# Patient Record
Sex: Female | Born: 1960 | Race: Black or African American | Hispanic: No | Marital: Married | Smoking: Never smoker
Health system: Southern US, Community
[De-identification: ages and names within clinical notes are randomized; demographics above are authoritative.]

## PROBLEM LIST (undated history)

## (undated) DIAGNOSIS — R739 Hyperglycemia, unspecified: Secondary | ICD-10-CM

## (undated) DIAGNOSIS — A6920 Lyme disease, unspecified: Secondary | ICD-10-CM

## (undated) DIAGNOSIS — M549 Dorsalgia, unspecified: Secondary | ICD-10-CM

## (undated) HISTORY — DX: Hyperglycemia, unspecified: R73.9

## (undated) HISTORY — PX: BACK SURGERY: SHX140

---

## 1999-10-04 ENCOUNTER — Encounter: Payer: Self-pay | Admitting: Family Medicine

## 1999-10-04 ENCOUNTER — Encounter: Admission: RE | Admit: 1999-10-04 | Discharge: 1999-10-04 | Payer: Self-pay | Admitting: Family Medicine

## 2002-04-22 ENCOUNTER — Encounter: Admission: RE | Admit: 2002-04-22 | Discharge: 2002-04-22 | Payer: Self-pay | Admitting: Family Medicine

## 2002-04-22 ENCOUNTER — Encounter: Payer: Self-pay | Admitting: Family Medicine

## 2002-12-16 ENCOUNTER — Other Ambulatory Visit: Admission: RE | Admit: 2002-12-16 | Discharge: 2002-12-16 | Payer: Self-pay | Admitting: Obstetrics and Gynecology

## 2004-10-03 ENCOUNTER — Encounter: Admission: RE | Admit: 2004-10-03 | Discharge: 2004-10-03 | Payer: Self-pay | Admitting: Family Medicine

## 2005-05-31 ENCOUNTER — Emergency Department (HOSPITAL_COMMUNITY): Admission: EM | Admit: 2005-05-31 | Discharge: 2005-06-01 | Payer: Self-pay | Admitting: Emergency Medicine

## 2005-08-09 ENCOUNTER — Emergency Department (HOSPITAL_COMMUNITY): Admission: EM | Admit: 2005-08-09 | Discharge: 2005-08-09 | Payer: Self-pay | Admitting: Emergency Medicine

## 2005-08-31 ENCOUNTER — Encounter: Admission: RE | Admit: 2005-08-31 | Discharge: 2005-08-31 | Payer: Self-pay | Admitting: Family Medicine

## 2006-09-30 IMAGING — CR DG ANKLE COMPLETE 3+V*L*
3 series · 3 of 3 positions shown · non-contrast
Comparison: None.

CLINICAL DATA: Status-post twisting injury.
 LEFT ANKLE, 3-VIEWS:

[view not recorded (1 of 3)]
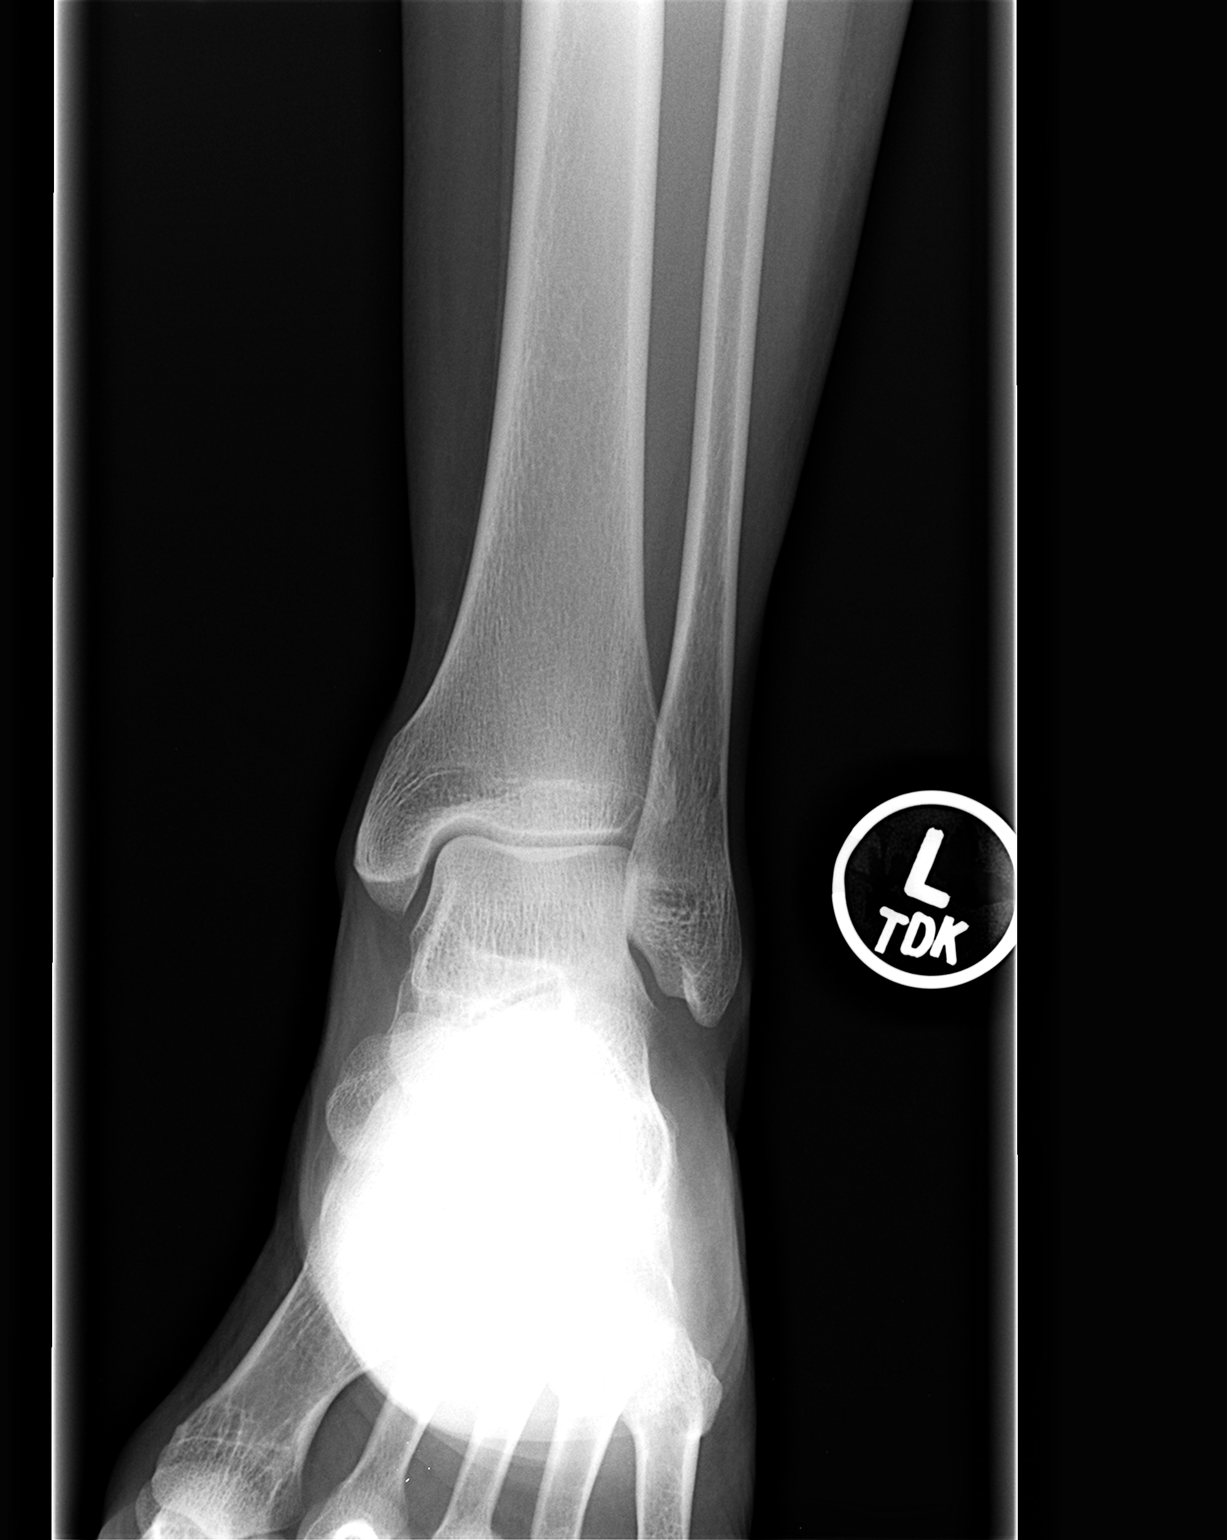

[view not recorded (2 of 3)]
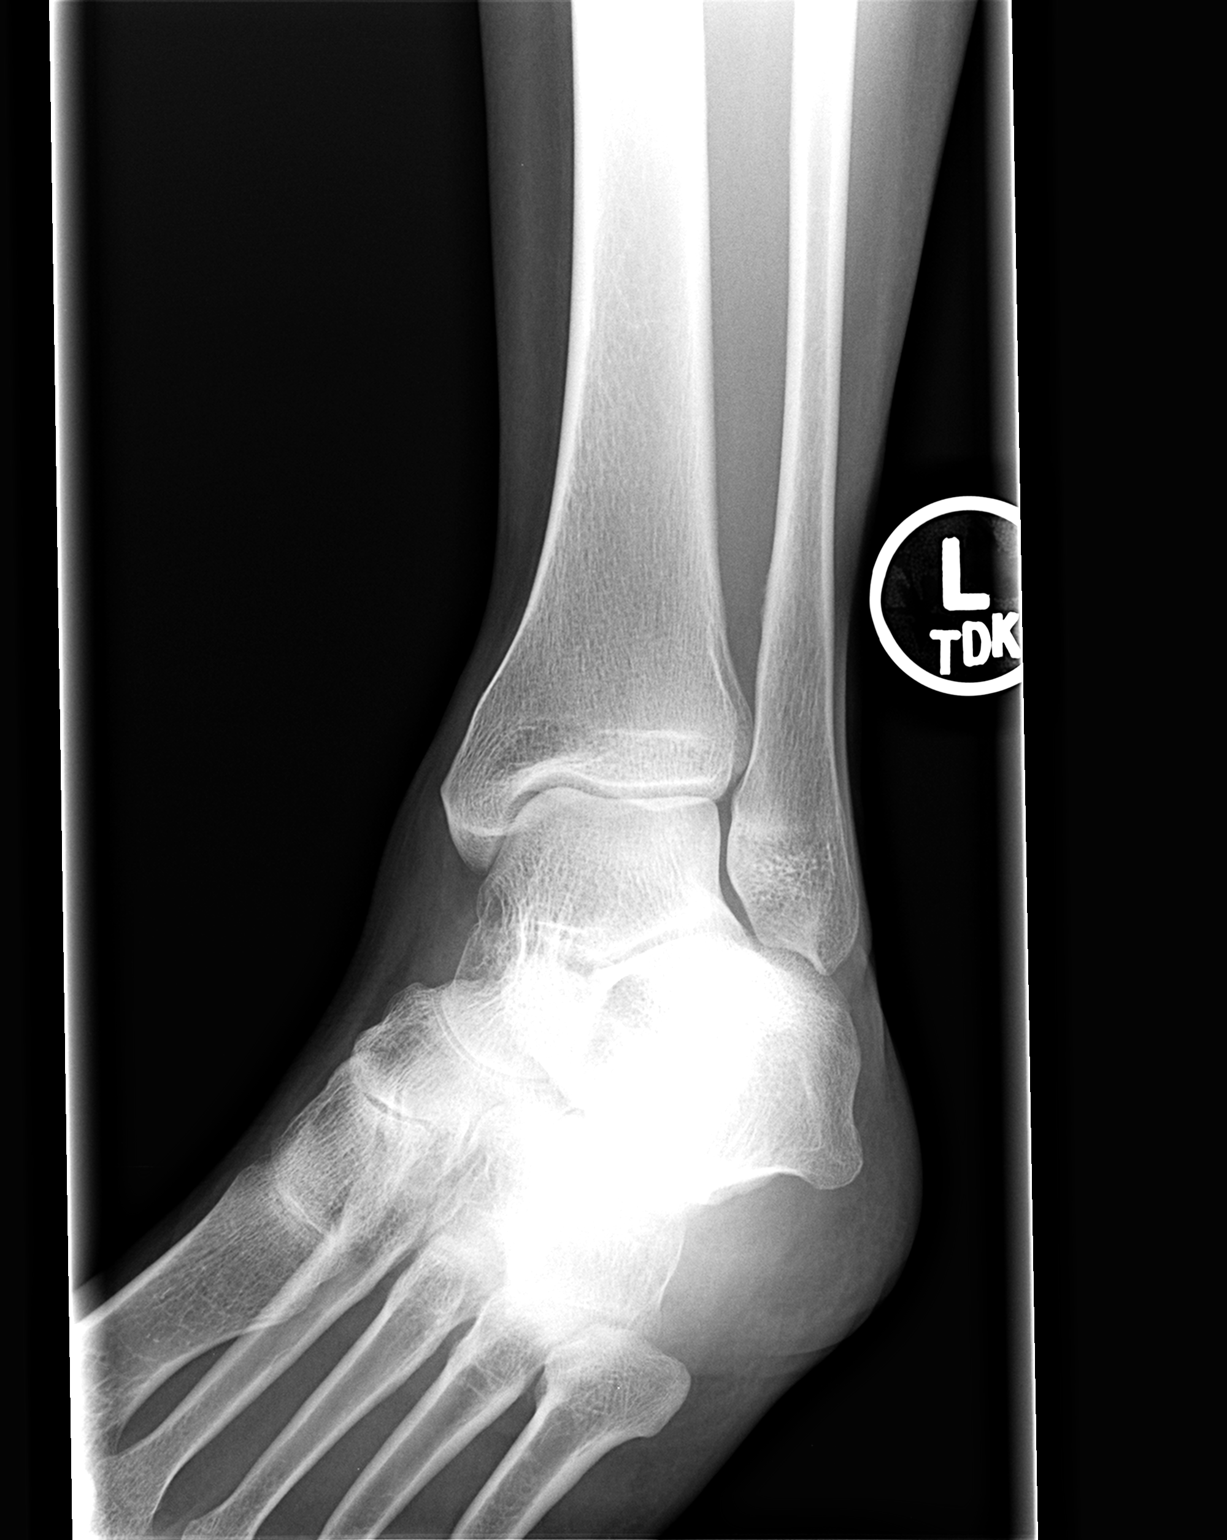

[view not recorded (3 of 3)]
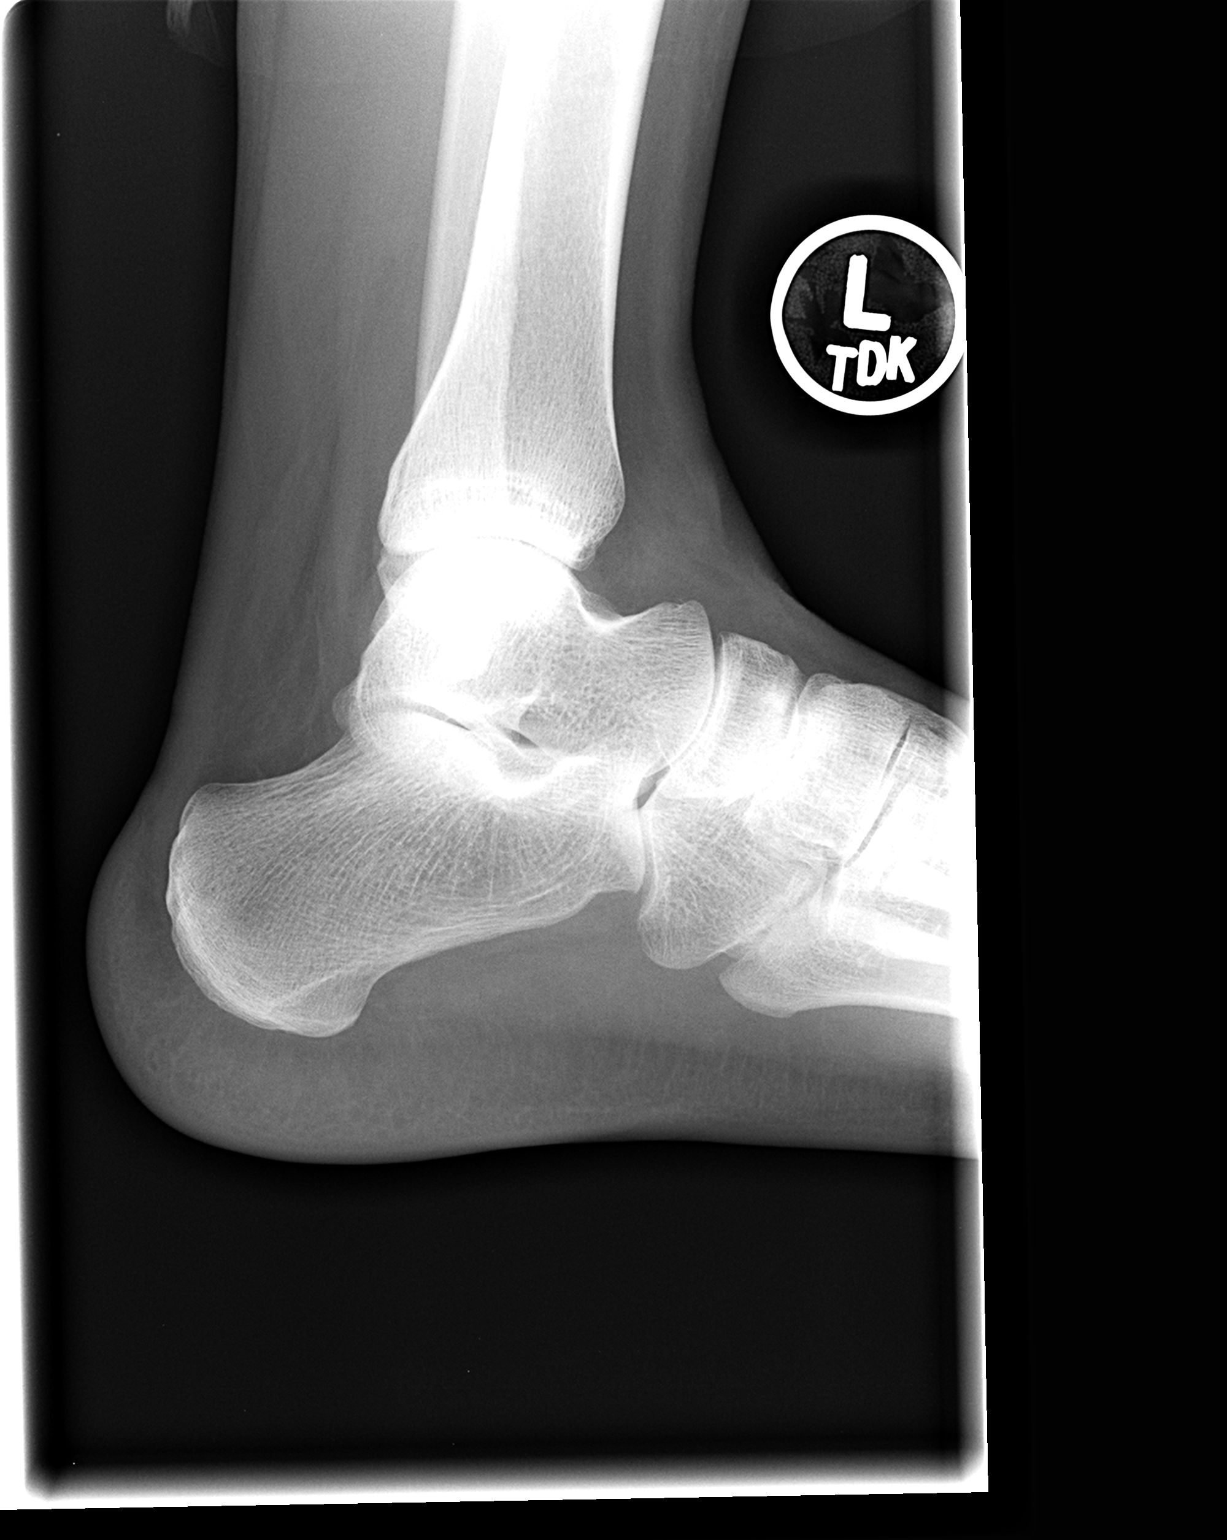

[3 of 3 positions shown; findings below may reference images not displayed]

FINDINGS: No acute radiographic abnormalities are noted.  Specifically, I see no fractures or dislocations.
IMPRESSION: No acute findings.

## 2008-02-09 ENCOUNTER — Encounter: Admission: RE | Admit: 2008-02-09 | Discharge: 2008-02-09 | Payer: Self-pay | Admitting: Family Medicine

## 2014-04-06 ENCOUNTER — Ambulatory Visit (INDEPENDENT_AMBULATORY_CARE_PROVIDER_SITE_OTHER): Payer: Self-pay | Admitting: Physical Therapy

## 2014-04-06 DIAGNOSIS — M2550 Pain in unspecified joint: Secondary | ICD-10-CM

## 2014-04-06 DIAGNOSIS — M542 Cervicalgia: Secondary | ICD-10-CM

## 2014-04-06 DIAGNOSIS — M6281 Muscle weakness (generalized): Secondary | ICD-10-CM

## 2014-04-08 ENCOUNTER — Encounter: Payer: Self-pay | Admitting: Physical Therapy

## 2014-04-13 ENCOUNTER — Encounter (INDEPENDENT_AMBULATORY_CARE_PROVIDER_SITE_OTHER): Payer: Self-pay | Admitting: Physical Therapy

## 2014-04-13 DIAGNOSIS — M2550 Pain in unspecified joint: Secondary | ICD-10-CM

## 2014-04-13 DIAGNOSIS — M6281 Muscle weakness (generalized): Secondary | ICD-10-CM

## 2014-04-13 DIAGNOSIS — M542 Cervicalgia: Secondary | ICD-10-CM

## 2014-04-15 ENCOUNTER — Encounter: Payer: Self-pay | Admitting: Physical Therapy

## 2014-04-20 ENCOUNTER — Encounter (INDEPENDENT_AMBULATORY_CARE_PROVIDER_SITE_OTHER): Payer: Self-pay | Admitting: Physical Therapy

## 2014-04-20 DIAGNOSIS — M542 Cervicalgia: Secondary | ICD-10-CM

## 2014-04-20 DIAGNOSIS — M6281 Muscle weakness (generalized): Secondary | ICD-10-CM

## 2014-04-20 DIAGNOSIS — M2550 Pain in unspecified joint: Secondary | ICD-10-CM

## 2014-04-28 ENCOUNTER — Encounter (INDEPENDENT_AMBULATORY_CARE_PROVIDER_SITE_OTHER): Payer: Self-pay | Admitting: Physical Therapy

## 2014-04-28 DIAGNOSIS — M2550 Pain in unspecified joint: Secondary | ICD-10-CM

## 2014-04-28 DIAGNOSIS — M542 Cervicalgia: Secondary | ICD-10-CM

## 2014-04-28 DIAGNOSIS — M6281 Muscle weakness (generalized): Secondary | ICD-10-CM

## 2014-05-13 ENCOUNTER — Encounter: Payer: Self-pay | Admitting: Physical Therapy

## 2014-05-26 ENCOUNTER — Encounter (INDEPENDENT_AMBULATORY_CARE_PROVIDER_SITE_OTHER): Payer: Self-pay | Admitting: Physical Therapy

## 2014-05-26 DIAGNOSIS — M542 Cervicalgia: Secondary | ICD-10-CM

## 2014-05-26 DIAGNOSIS — M2550 Pain in unspecified joint: Secondary | ICD-10-CM

## 2014-05-26 DIAGNOSIS — M6281 Muscle weakness (generalized): Secondary | ICD-10-CM

## 2015-11-19 ENCOUNTER — Encounter: Payer: Self-pay | Admitting: Emergency Medicine

## 2015-11-19 ENCOUNTER — Emergency Department
Admission: EM | Admit: 2015-11-19 | Discharge: 2015-11-20 | Disposition: A | Discharge status: Home / Self Care | Payer: BLUE CROSS/BLUE SHIELD | Attending: Emergency Medicine | Admitting: Emergency Medicine

## 2015-11-19 DIAGNOSIS — M545 Low back pain: Secondary | ICD-10-CM | POA: Diagnosis present

## 2015-11-19 DIAGNOSIS — N12 Tubulo-interstitial nephritis, not specified as acute or chronic: Secondary | ICD-10-CM | POA: Diagnosis not present

## 2015-11-19 HISTORY — DX: Dorsalgia, unspecified: M54.9

## 2015-11-19 LAB — CBC
HEMATOCRIT: 40.3 % (ref 35.0–47.0)
Hemoglobin: 13.6 g/dL (ref 12.0–16.0)
MCH: 30.2 pg (ref 26.0–34.0)
MCHC: 33.7 g/dL (ref 32.0–36.0)
MCV: 89.8 fL (ref 80.0–100.0)
Platelets: 154 10*3/uL (ref 150–440)
RBC: 4.49 MIL/uL (ref 3.80–5.20)
RDW: 13.2 % (ref 11.5–14.5)
WBC: 9.1 10*3/uL (ref 3.6–11.0)

## 2015-11-19 LAB — URINALYSIS COMPLETE WITH MICROSCOPIC (ARMC ONLY)
BACTERIA UA: NONE SEEN
Bilirubin Urine: NEGATIVE
Glucose, UA: NEGATIVE mg/dL
NITRITE: NEGATIVE
PROTEIN: NEGATIVE mg/dL
Specific Gravity, Urine: 1.017 (ref 1.005–1.030)
pH: 6 (ref 5.0–8.0)

## 2015-11-19 LAB — COMPREHENSIVE METABOLIC PANEL
ALT: 46 U/L (ref 14–54)
AST: 44 U/L — ABNORMAL HIGH (ref 15–41)
Albumin: 4.7 g/dL (ref 3.5–5.0)
Alkaline Phosphatase: 89 U/L (ref 38–126)
Anion gap: 11 (ref 5–15)
BUN: 18 mg/dL (ref 6–20)
CO2: 21 mmol/L — ABNORMAL LOW (ref 22–32)
Calcium: 9.6 mg/dL (ref 8.9–10.3)
Chloride: 111 mmol/L (ref 101–111)
Creatinine, Ser: 1.08 mg/dL — ABNORMAL HIGH (ref 0.44–1.00)
GFR calc Af Amer: >60 mL/min (ref >60)
GFR calc non Af Amer: 57 mL/min — ABNORMAL LOW (ref >60)
Glucose, Bld: 99 mg/dL (ref 65–99)
Potassium: 3.5 mmol/L (ref 3.5–5.1)
Sodium: 143 mmol/L (ref 135–145)
Total Bilirubin: 0.5 mg/dL (ref 0.3–1.2)
Total Protein: 8.1 g/dL (ref 6.5–8.1)

## 2015-11-19 LAB — LIPASE, BLOOD: Lipase: 17 U/L (ref 11–51)

## 2015-11-19 MED ORDER — PENTAFLUOROPROP-TETRAFLUOROETH EX AERO
INHALATION_SPRAY | CUTANEOUS | Status: AC
  Filled 2015-11-19: qty 30

## 2015-11-19 MED ORDER — ONDANSETRON HCL 4 MG/2ML IJ SOLN
4.0000 mg | Freq: Once | INTRAMUSCULAR | Status: AC
  Administered 2015-11-19: 4 mg via INTRAVENOUS
  Filled 2015-11-19: qty 2

## 2015-11-19 MED ORDER — ONDANSETRON 4 MG PO TBDP
4.0000 mg | ORAL_TABLET | Freq: Once | ORAL | Status: AC | PRN
Start: 2015-11-19 — End: 2015-11-19
  Administered 2015-11-19: 4 mg via ORAL
  Filled 2015-11-19: qty 1

## 2015-11-19 MED ORDER — OXYCODONE-ACETAMINOPHEN 5-325 MG PO TABS
1.0000 | ORAL_TABLET | Freq: Once | ORAL | Status: AC
  Administered 2015-11-19: 1 via ORAL
  Filled 2015-11-19: qty 1

## 2015-11-19 MED ORDER — CEPHALEXIN 500 MG PO CAPS: 500.0000 mg | ORAL_CAPSULE | Freq: Two times a day (BID) | ORAL | Status: DC

## 2015-11-19 MED ORDER — SODIUM CHLORIDE 0.9 % IV BOLUS (SEPSIS)
1000.0000 mL | Freq: Once | INTRAVENOUS | Status: AC
  Administered 2015-11-19: 1000 mL via INTRAVENOUS

## 2015-11-19 MED ORDER — DEXTROSE 5 % IV SOLN
1.0000 g | Freq: Once | INTRAVENOUS | Status: AC
  Administered 2015-11-19: 1 g via INTRAVENOUS
  Filled 2015-11-19: qty 10

## 2015-11-19 MED ORDER — MORPHINE SULFATE (PF) 4 MG/ML IV SOLN
4.0000 mg | Freq: Once | INTRAVENOUS | Status: AC
  Administered 2015-11-19: 4 mg via INTRAVENOUS
  Filled 2015-11-19: qty 1

## 2015-11-19 MED ORDER — OXYCODONE-ACETAMINOPHEN 5-325 MG PO TABS: 1.0000 | ORAL_TABLET | Freq: Four times a day (QID) | ORAL | Status: DC | PRN

## 2015-11-19 NOTE — Discharge Instructions (Signed)
You were prescribed a medication that is potentially sedating. Do not drink alcohol, drive or participate in any other potentially dangerous activities while taking this medication as it may make you sleepy. Do not take this medication with any other sedating medications, either prescription or over-the-counter. If you were prescribed Percocet or Vicodin, do not take these with acetaminophen (Tylenol) as it is already contained within these medications. °  °Opioid pain medications (or "narcotics") can be habit forming.  Use it as little as possible to achieve adequate pain control.  Do not use or use it with extreme caution if you have a history of opiate abuse or dependence.  If you are on a pain contract with your primary care doctor or a pain specialist, be sure to let them know you were prescribed this medication today from the  [facility].  This medication is intended for your use only - do not give any to anyone else and keep it in a secure place where nobody else, especially children and pets, have access to it.  It will also cause or worsen constipation, so you may want to consider taking an over-the-counter stool softener while you are taking this medication. ° °Pyelonephritis, Adult °Pyelonephritis is a kidney infection. The kidneys are the organs that filter a person's blood and move waste out of the bloodstream and into the urine. Urine passes from the kidneys, through the ureters, and into the bladder. There are two main types of pyelonephritis: °· Infections that come on quickly without any warning (acute pyelonephritis). °· Infections that last for a long period of time (chronic pyelonephritis). °In most cases, the infection clears up with treatment and does not cause further problems. More severe infections or chronic infections can sometimes spread to the bloodstream or lead to other problems with the kidneys. °CAUSES °This condition is usually caused by: °· Bacteria  traveling from the bladder to the kidney through infected urine. The urine in the bladder can become infected with bacteria from: °¨ Bladder infection (cystitis). °¨ Inflammation of the prostate gland (prostatitis). °¨ Sexual intercourse, in females. °· Bacteria traveling from the bloodstream to the kidney. °RISK FACTORS °This condition is more likely to develop in: °· Pregnant women. °· Older people. °· People who have diabetes. °· People who have kidney stones or bladder stones. °· People who have other abnormalities of the kidney or ureter. °· People who have a catheter placed in the bladder. °· People who have cancer. °· People who are sexually active. °· Women who use spermicides. °· People who have had a prior urinary tract infection. °SYMPTOMS °Symptoms of this condition include: °· Frequent urination. °· Strong or persistent urge to urinate. °· Burning or stinging when urinating. °· Abdominal pain. °· Back pain. °· Pain in the side or flank area. °· Fever. °· Chills. °· Blood in the urine, or dark urine. °· Nausea. °· Vomiting. °DIAGNOSIS °This condition may be diagnosed based on: °· Medical history and physical exam. °· Urine tests. °· Blood tests. °You may also have imaging tests of the kidneys, such as an ultrasound or CT scan. °TREATMENT °Treatment for this condition may depend on the severity of the infection. °· If the infection is mild and is found early, you may be treated with antibiotic medicines taken by mouth. You will need to drink fluids to remain hydrated. °· If the infection is more severe, you may need to stay in the hospital and receive antibiotics given directly into a vein through an   IV tube. You may also need to receive fluids through an IV tube if you are not able to remain hydrated. After your hospital stay, you may need to take oral antibiotics for a period of time. °Other treatments may be required, depending on the cause of the infection. °HOME CARE INSTRUCTIONS °Medicines °· Take  over-the-counter and prescription medicines only as told by your health care provider. °· If you were prescribed an antibiotic medicine, take it as told by your health care provider. Do not stop taking the antibiotic even if you start to feel better. °General Instructions °· Drink enough fluid to keep your urine clear or pale yellow. °· Avoid caffeine, tea, and carbonated beverages. They tend to irritate the bladder. °· Urinate often. Avoid holding in urine for long periods of time. °· Urinate before and after sex. °· After a bowel movement, women should cleanse from front to back. Use each tissue only once. °· Keep all follow-up visits as told by your health care provider. This is important. °SEEK MEDICAL CARE IF: °· Your symptoms do not get better after 2 days of treatment. °· Your symptoms get worse. °· You have a fever. °SEEK IMMEDIATE MEDICAL CARE IF: °· You are unable to take your antibiotics or fluids. °· You have shaking chills. °· You vomit. °· You have severe flank or back pain. °· You have extreme weakness or fainting. °  °This information is not intended to replace advice given to you by your health care provider. Make sure you discuss any questions you have with your health care provider. °  °Document Released: 09/02/2005 Document Revised: 05/24/2015 Document Reviewed: 12/26/2014 °Elsevier Interactive Patient Education ©2016 [employer] ° °

## 2015-11-19 NOTE — ED Provider Notes (Signed)
[facility] Emergency Department Provider Note  ____________________________________________  Time seen: 10:40 PM  I have reviewed the triage vital signs and the nursing notes.   HISTORY  Chief Complaint Back Pain and Abdominal Pain    HPI Paula Ramirez is a 55 y.o. female who complains of left lower back pain for the past few hours. Gradual onset, constant since then. Gradually worsening. She's also had dysuria and urgency today. Denies fever chills, positive nausea, vomited once today. Nausea is improved at present time. No diarrhea or constipation. Normal bowel movements including one today  No chest pain or shortness of breath...     Past Medical History  Diagnosis Date  . Back pain      There are no active problems to display for this patient.    Past Surgical History  Procedure Laterality Date  . Back surgery       Current Outpatient Rx  Name  Route  Sig  Dispense  Refill  . cephALEXin (KEFLEX) 500 MG capsule   Oral   Take 1 capsule (500 mg total) by mouth 2 (two) times daily.   14 capsule   0   . oxyCODONE-acetaminophen (ROXICET) 5-325 MG tablet   Oral   Take 1 tablet by mouth every 6 (six) hours as needed for severe pain.   12 tablet   0      Allergies Celebrex   History reviewed. No pertinent family history.  Social History Social History  Substance Use Topics  . Smoking status: Never Smoker   . Smokeless tobacco: None  . Alcohol Use: No    Review of Systems  Constitutional:   No fever or chills. No weight changes Eyes:   No blurry vision or double vision.  ENT:   No sore throat.  Cardiovascular:   No chest pain. Respiratory:   No dyspnea or cough. Gastrointestinal:   Negative for abdominal pain, no constipation or diarrhea. Vomited times one.  No BRBPR or melena. Genitourinary:   Positive dysuria and urgency. No hematuria. Musculoskeletal:   Positive left flank pain today. Chronic right  sciatica. Skin:   Negative for rash. Neurological:   Negative for headaches, focal weakness or numbness. Psychiatric:  No anxiety or depression.   Endocrine:  No changes in energy or sleep difficulty.  10-point ROS otherwise negative.  ____________________________________________   PHYSICAL EXAM:  VITAL SIGNS: ED Triage Vitals  Enc Vitals Group     BP 11/19/15 1909 149/84 mmHg     Pulse Rate 11/19/15 1909 79     Resp 11/19/15 1909 18     Temp 11/19/15 1909 98.4 F (36.9 C)     Temp Source 11/19/15 1909 Oral     SpO2 11/19/15 1909 100 %     Weight 11/19/15 1909 140 lb (63.504 kg)     Height 11/19/15 1909 5' 3.5" (1.613 m)     Head Cir --      Peak Flow --      Pain Score 11/19/15 1910 10     Pain Loc --      Pain Edu? --      Excl. in GC? --     Vital signs reviewed, nursing assessments reviewed.   Constitutional:   Alert and oriented. Well appearing and in no distress. Eyes:   No scleral icterus. No conjunctival pallor. PERRL. EOMI ENT   Head:   Normocephalic and atraumatic.   Nose:   No congestion/rhinnorhea. No septal hematoma   Mouth/Throat:  MMM, no pharyngeal erythema. No peritonsillar mass.    Neck:   No stridor. No SubQ emphysema. No meningismus. Hematological/Lymphatic/Immunilogical:   No cervical lymphadenopathy. Cardiovascular:   RRR. Symmetric bilateral radial and DP pulses.  No murmurs.  Respiratory:   Normal respiratory effort without tachypnea nor retractions. Breath sounds are clear and equal bilaterally. No wheezes/rales/rhonchi. Gastrointestinal:   Soft with mild mid left-sided tenderness. Non distended. There is left-sided CVA tenderness.  No rebound, rigidity, or guarding. Genitourinary:   deferred Musculoskeletal:   Nontender with normal range of motion in all extremities. No joint effusions.  No lower extremity tenderness.  No edema. Neurologic:   Normal speech and language.  CN 2-10 normal. Motor grossly intact. No gross focal  neurologic deficits are appreciated.  Skin:    Skin is warm, dry and intact. No rash noted.  No petechiae, purpura, or bullae. Psychiatric:   Mood and affect are normal. ____________________________________________    LABS (pertinent positives/negatives) (all labs ordered are listed, but only abnormal results are displayed) Labs Reviewed  COMPREHENSIVE METABOLIC PANEL - Abnormal; Notable for the following:    CO2 21 (*)    Creatinine, Ser 1.08 (*)    AST 44 (*)    GFR calc non Af Amer 57 (*)    All other components within normal limits  URINALYSIS COMPLETEWITH MICROSCOPIC ([facility] ONLY) - Abnormal; Notable for the following:    Color, Urine YELLOW (*)    APPearance HAZY (*)    Ketones, ur TRACE (*)    Hgb urine dipstick 1+ (*)    Leukocytes, UA 3+ (*)    Squamous Epithelial / LPF 0-5 (*)    All other components within normal limits  URINE CULTURE  LIPASE, BLOOD  CBC   ____________________________________________   EKG    ____________________________________________    RADIOLOGY    ____________________________________________   PROCEDURES   ____________________________________________   INITIAL IMPRESSION / ASSESSMENT AND PLAN / ED COURSE  Pertinent labs & imaging results that were available during my care of the patient were reviewed by me and considered in my medical decision making (see chart for details).  Patient presents with symptoms of urinary tract infection, exam and urinalysis consistent with UTI and pyelonephritis. Vital signs are unremarkable she is well-appearing no acute distress. Kidney function is good. Relatively young and healthy. We'll give IV fluids and ceftriaxone and discharged on Keflex. Follow up with primary care. Urine culture sent.  No history of kidney stones, by current symptoms I have a low suspicion for an infected kidney stone   ____________________________________________   FINAL CLINICAL IMPRESSION(S) / ED  DIAGNOSES  Final diagnoses:  Pyelonephritis      Sharman CheekPhillip Paraskevi Funez, MD 11/19/15 2310

## 2015-11-19 NOTE — ED Notes (Signed)
Pt c/o left lower back pain x 20 minutes; stomach feels "achy"; vomited once; denies nausea at present; last voided about 25 minutes ago-denies dysuria; denies diarrhea; denies injury; history of lower right back pain; pain radiates down the outer and front left leg;

## 2015-11-21 LAB — URINE CULTURE

## 2016-03-26 ENCOUNTER — Ambulatory Visit: Payer: BLUE CROSS/BLUE SHIELD | Admitting: Dietician

## 2016-04-15 ENCOUNTER — Encounter: Payer: BLUE CROSS/BLUE SHIELD | Attending: Obstetrics and Gynecology | Admitting: Dietician

## 2016-04-15 ENCOUNTER — Encounter: Payer: Self-pay | Admitting: Dietician

## 2016-04-15 ENCOUNTER — Ambulatory Visit: Payer: BLUE CROSS/BLUE SHIELD | Admitting: Dietician

## 2016-04-15 DIAGNOSIS — R739 Hyperglycemia, unspecified: Secondary | ICD-10-CM | POA: Diagnosis present

## 2016-04-15 DIAGNOSIS — R7303 Prediabetes: Secondary | ICD-10-CM

## 2016-04-15 NOTE — Progress Notes (Signed)
  Medical Nutrition Therapy:  Appt start time: 1530 end time:  1630.   Assessment:  Primary concerns today: Patient is here today alone due to referral for hyperglycemia.  Her HgbA1C was 6.0% 10/17/15. Other Labs noted include:  Vitamin D 28.7 (1/31/170, cholesterol 234, triglycerides 114, HDL 66, LDL 145..  Today's weight 134 lbs which had decreased from 140 lbs in February. She has already made changes by reducing the carbohydrate amount in her beverages and decreasing the frequency of other sweets.  Patient lives with her husband.  Her husband does most of the shopping and cooking or they eat out.  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  She visits her mother on the weekend and takes care of her house and does her shopping.  Her father has dementia.     Preferred Learning Style:   No preference indicated   Learning Readiness:   Ready  Change in progress   MEDICATIONS: see list to include vitamin D, vitamin E and B-complex   DIETARY INTAKE:  Usual eating pattern includes 3 meals and 0 snacks per day. She states that she eats to live rather than living to eat.  24-hr recall:  B (9 AM): unsweetened hot tea and NABS Snk ( AM): none or fruit  L ( PM): Fast food:  Wendy's salad or Jr. Cheeseburger delux OR [employer] salad Snk ( PM): none D ( PM): spaghetti and garlic bread or pork chops, peas or corn Snk ( PM): none Beverages: unsweetened hot tea, water, rare regular soda OR regular lemonade  Usual physical activity: none  Estimated energy needs: 1600 calories 180 g carbohydrates 120 g protein 44 g fat  Progress Towards Goal(s):  In progress.   Nutritional Diagnosis:  NB-1.1 Food and nutrition-related knowledge deficit As related to balance of carbohydrate, protein, and fat.  As evidenced by diet hx and patient report.    Intervention:  Nutrition counseling/education related to prediabetes. Discussed Carb Counting by  food group as method of portion control, reading food labels, and benefits of increased activity. Also discussed basic physiology of Diabetes, target BG ranges pre and post meals, and A1c. Discussed importance of nutrient varied diet and the importance of exercise.  Begin some form of exercise.  Aim for 30 minutes most day. Continue to choose beverages without added sugar. Bake, broil, or grill rather than fry. Read the label for carbohydrate content and portion size.   Teaching Method Utilized:  Visual Auditory Hands on  Handouts given during visit include: Living Well with Diabetes Carb Counting and Food Label handouts Meal Plan Card  Breakfast ideas  Label reading  A1C sheet  Barriers to learning/adherence to lifestyle change: none  Demonstrated degree of understanding via:  Teach Back   Monitoring/Evaluation:  Dietary intake, exercise, label reading, and body weight prn.

## 2016-04-15 NOTE — Patient Instructions (Signed)
Begin some form of exercise.  Aim for 30 minutes most day. Continue to choose beverages without added sugar. Bake, broil, or grill rather than fry. Read the label for carbohydrate content and portion size.

## 2017-04-09 ENCOUNTER — Other Ambulatory Visit: Payer: Self-pay | Admitting: Surgery

## 2017-04-09 DIAGNOSIS — M7989 Other specified soft tissue disorders: Secondary | ICD-10-CM

## 2017-04-09 DIAGNOSIS — R2232 Localized swelling, mass and lump, left upper limb: Principal | ICD-10-CM

## 2017-04-21 ENCOUNTER — Ambulatory Visit
Admission: RE | Admit: 2017-04-21 | Discharge: 2017-04-21 | Disposition: A | Discharge status: Home / Self Care | Payer: BLUE CROSS/BLUE SHIELD | Source: Ambulatory Visit | Attending: Surgery | Admitting: Surgery

## 2017-04-21 DIAGNOSIS — R2232 Localized swelling, mass and lump, left upper limb: Principal | ICD-10-CM

## 2017-04-21 DIAGNOSIS — M7989 Other specified soft tissue disorders: Secondary | ICD-10-CM

## 2017-04-21 MED ORDER — GADOBENATE DIMEGLUMINE 529 MG/ML IV SOLN
13.0000 mL | Freq: Once | INTRAVENOUS | Status: AC | PRN
  Administered 2017-04-21: 13 mL via INTRAVENOUS

## 2017-10-14 DIAGNOSIS — M51369 Other intervertebral disc degeneration, lumbar region without mention of lumbar back pain or lower extremity pain: Secondary | ICD-10-CM | POA: Insufficient documentation

## 2018-08-14 ENCOUNTER — Encounter: Payer: Self-pay | Admitting: Emergency Medicine

## 2018-08-14 ENCOUNTER — Ambulatory Visit
Admission: EM | Admit: 2018-08-14 | Discharge: 2018-08-14 | Disposition: A | Discharge status: Home / Self Care | Payer: BLUE CROSS/BLUE SHIELD | Attending: Emergency Medicine | Admitting: Emergency Medicine

## 2018-08-14 ENCOUNTER — Other Ambulatory Visit: Payer: Self-pay

## 2018-08-14 DIAGNOSIS — L509 Urticaria, unspecified: Secondary | ICD-10-CM | POA: Diagnosis not present

## 2018-08-14 HISTORY — DX: Lyme disease, unspecified: A69.20

## 2018-08-14 MED ORDER — PREDNISONE 10 MG (21) PO TBPK: ORAL_TABLET | ORAL | 0 refills | Status: DC

## 2018-08-14 MED ORDER — HYDROXYZINE HCL 50 MG PO TABS: 50.0000 mg | ORAL_TABLET | Freq: Three times a day (TID) | ORAL | 0 refills | Status: DC | PRN

## 2018-08-14 NOTE — ED Provider Notes (Signed)
HPI  SUBJECTIVE:  Paula Ramirez is a 57 y.o. female who presents with red raised, migratory, itchy rash over her whole body for the past month.  She tried over-the-counter cortisone cream, eczema cream and Benadryl cream with very temporary improvement in her symptoms.  No aggravating factors.  She states that each lesion takes about 2 to 3 days to completely resolve.  No new lotions, soaps, detergents, she does not take any medications on a regular basis. She takes vitamins.  New foods, drinks.  No recent antibiotics.  No travel, hotel stays prior to rash starting.  No blood on the bed clothes in the morning, sensation of being bitten at night.  Her husband does not have a similar rash.  No lip or tongue swelling, wheezing, chest pain, shortness of breath, abdominal pain, diarrhea, syncope.  This rash is not related to exercise.  She has a past medical history of eczema, Lyme disease.  No history of allergies, anaphylaxis, asthma.  PMD: [place].   Past Medical History:  Diagnosis Date  . Back pain   . Hyperglycemia   . Lyme disease     Past Surgical History:  Procedure Laterality Date  . BACK SURGERY      Family History  Problem Relation Age of Onset  . Hypertension Mother   . Diabetes Mother     Social History   Tobacco Use  . Smoking status: Never Smoker  . Smokeless tobacco: Never Used  Substance Use Topics  . Alcohol use: No  . Drug use: Never    No current facility-administered medications for this encounter.   Current Outpatient Medications:  .  b complex vitamins tablet, Take 1 tablet by mouth daily., Disp: , Rfl:  .  Multiple Vitamin (MULTIVITAMIN) tablet, Take 1 tablet by mouth daily., Disp: , Rfl:  .  hydrOXYzine (ATARAX/VISTARIL) 50 MG tablet, Take 1 tablet (50 mg total) by mouth 3 (three) times daily as needed., Disp: 30 tablet, Rfl: 0 .  predniSONE (STERAPRED UNI-PAK 21 TAB) 10 MG (21) TBPK tablet, Dispense one 6 day pack. Take as directed with  food., Disp: 21 tablet, Rfl: 0  Allergies  Allergen Reactions  . Celebrex [Celecoxib] Itching     ROS  As noted in HPI.   Physical Exam  BP 127/83 (BP Location: Left Arm)   Pulse 69   Temp 97.6 F (36.4 C) (Oral)   Resp 16   Ht 5\' 3"  (1.6 m)   Wt 63.5 kg   SpO2 100%   BMI 24.80 kg/m   Constitutional: Well developed, well nourished, no acute distress Eyes:  EOMI, conjunctiva normal bilaterally HENT: Normocephalic, atraumatic,mucus membranes moist, no angioedema Respiratory: Normal inspiratory effort, lungs clear bilaterally, good air movement Cardiovascular: Normal rate GI: nondistended skin: Positive scattered blanchable nontender urticaria over arms, neck.               Musculoskeletal: no deformities Neurologic: Alert & oriented x 3, no focal neuro deficits Psychiatric: Speech and behavior appropriate   ED Course   Medications - No data to display  No orders of the defined types were placed in this encounter.   No results found for this or any previous visit (from the past 24 hour(s)). No results found.  ED Clinical Impression  Urticaria   ED Assessment/Plan  Patient with urticaria.  Unable to identify cause.  Sending home with 6-day prednisone taper, Zyrtec, Atarax. advised her to follow-up with an allergist, provided information for [facility] allergy and asthma.  Discussed MDM, treatment plan, and plan for follow-up with patient.  patient agrees with plan.   Meds ordered this encounter  Medications  . hydrOXYzine (ATARAX/VISTARIL) 50 MG tablet    Sig: Take 1 tablet (50 mg total) by mouth 3 (three) times daily as needed.    Dispense:  30 tablet    Refill:  0  . predniSONE (STERAPRED UNI-PAK 21 TAB) 10 MG (21) TBPK tablet    Sig: Dispense one 6 day pack. Take as directed with food.    Dispense:  21 tablet    Refill:  0    *This clinic note was created using Scientist, clinical (histocompatibility and immunogenetics)Dragon dictation software. Therefore, there may be occasional mistakes  despite careful proofreading.   ?   Domenick GongMortenson, Raye Slyter, MD 08/14/18 [id number]

## 2018-08-14 NOTE — ED Triage Notes (Signed)
Patient c/o itchy rash on her arms and neck that started a month ago.

## 2018-08-14 NOTE — Discharge Instructions (Addendum)
Start some Zyrtec in addition to the prednisone.  You may take Atarax if Zyrtec does not work.  Check your bed for bedbugs although I doubt that this is the cause.  [facility]

## 2018-08-21 IMAGING — MR MR SHOULDER*L* WO/W CM
10 series · 40 of 40 positions shown · IV contrast (13ml multihance)
Comparison: None.

CLINICAL DATA: Soft tissue mass of the shoulder x1 year, large and
tender.

EXAM:
MRI OF THE LEFT SHOULDER WITHOUT AND WITH CONTRAST
TECHNIQUE: Multiplanar, multisequence MR imaging of the left shoulder was
performed before and after the administration of intravenous
contrast.
CONTRAST:  13mL MULTIHANCE GADOBENATE DIMEGLUMINE 529 MG/ML IV SOLN

[Series 3: T2 fat-sat · axial · 4.0mm · 0.29mm/px · z∈[-39,+66]mm · 4 of 24 slices shown (1 of 2)]
[im 1/24]
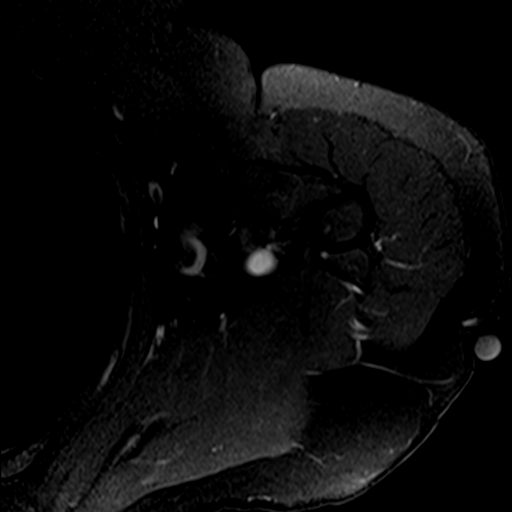
[im 8/24]
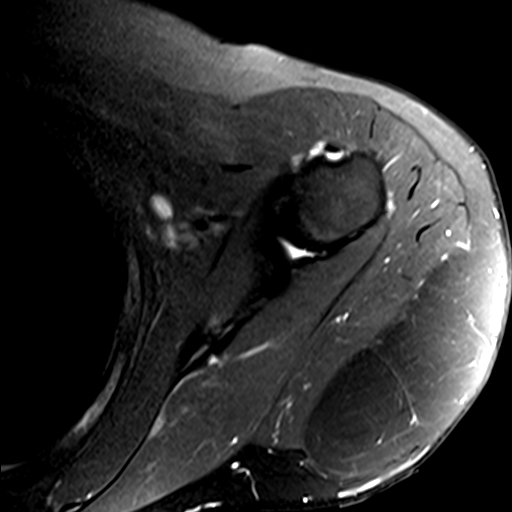
[im 16/24]
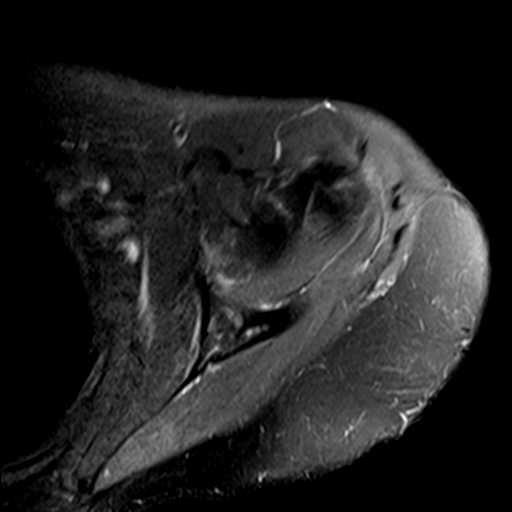
[im 24/24]
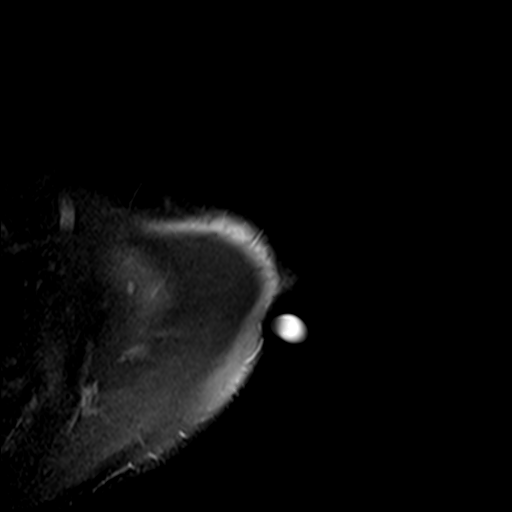

[Series 4: T2 fat-sat · oblique · 4.0mm · 0.62mm/px · 4 of 23 slices shown (2 of 2)]
[im 1/23]
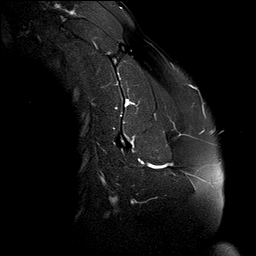
[im 8/23]
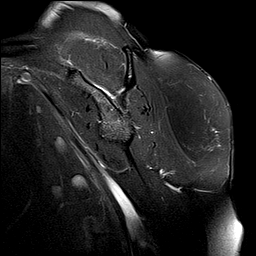
[im 15/23]
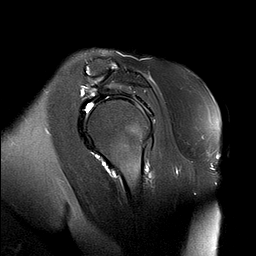
[im 23/23]
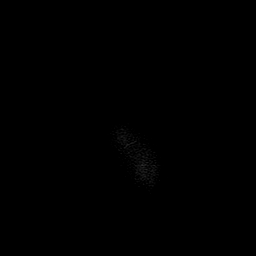

[Series 5: t2_tse_cor · oblique · 4.0mm · 0.66mm/px · 4 of 23 slices shown]
[im 1/23]
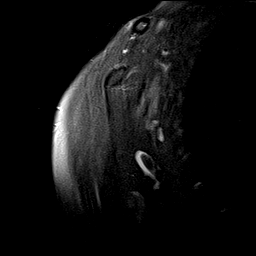
[im 8/23]
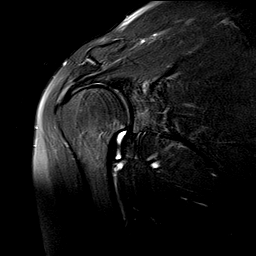
[im 15/23]
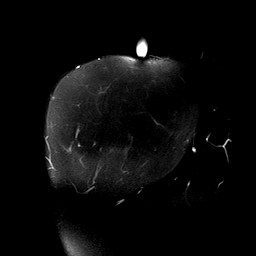
[im 23/23]
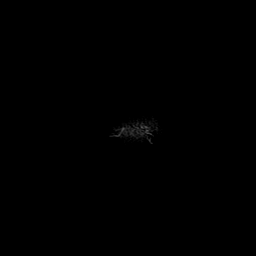

[Series 6: T1 · oblique · 4.0mm · 0.66mm/px · 4 of 23 slices shown]
[im 1/23]
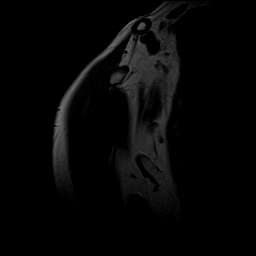
[im 8/23]
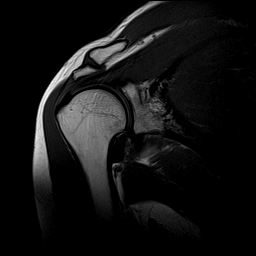
[im 15/23]
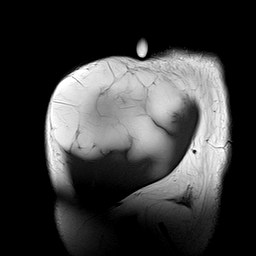
[im 23/23]
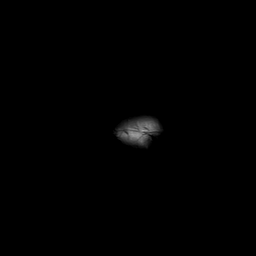

[Series 7: pd_tse_cor · oblique · 4.0mm · 0.66mm/px · 4 of 23 slices shown]
[im 1/23]
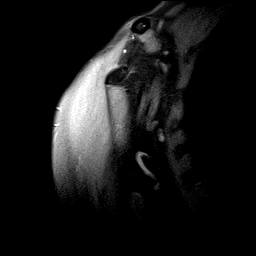
[im 8/23]
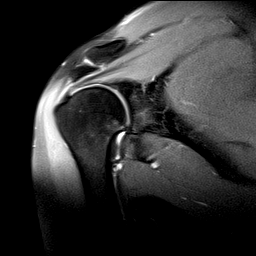
[im 15/23]
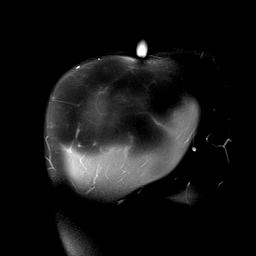
[im 23/23]
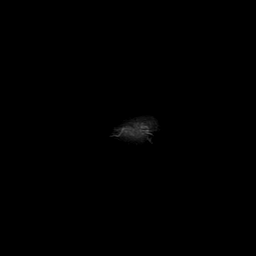

[Series 8: (id) fs · axial · 4.0mm · 0.70mm/px · z∈[-41,+64]mm · 4 of 24 slices shown (1 of 2)]
[im 1/24]
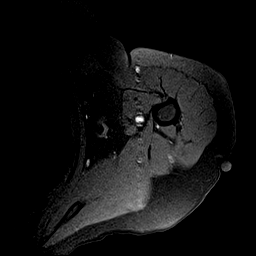
[im 8/24]
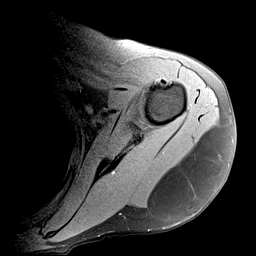
[im 16/24]
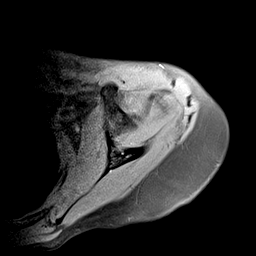
[im 24/24]
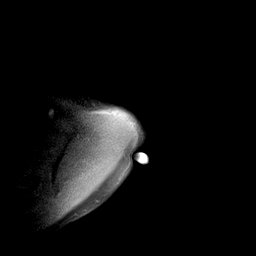

[Series 9: (id) · axial · 4.0mm · 0.70mm/px · z∈[-41,+64]mm · 4 of 24 slices shown]
[im 1/24]
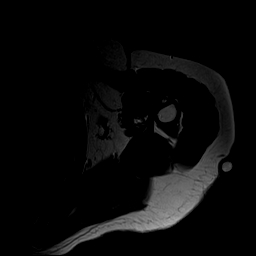
[im 8/24]
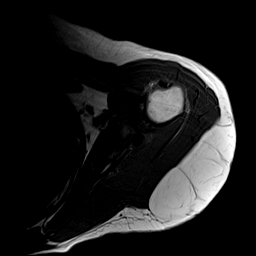
[im 16/24]
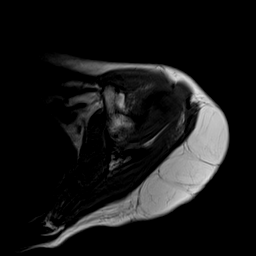
[im 24/24]
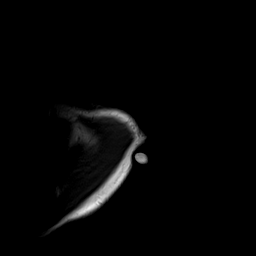

[Series 10: (id) fs · axial · 4.0mm · 0.70mm/px · z∈[-41,+64]mm · 4 of 24 slices shown (2 of 2)]
[im 1/24]
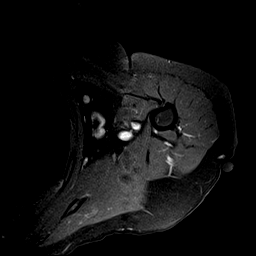
[im 8/24]
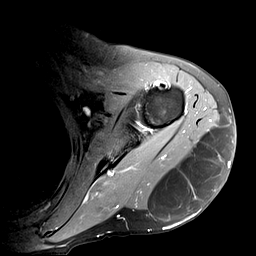
[im 16/24]
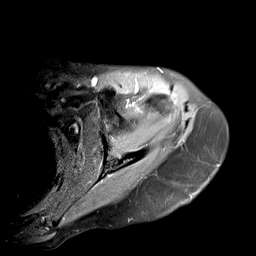
[im 24/24]
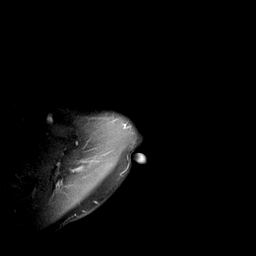

[Series 11: T1 fat-sat · oblique · 4.0mm · 0.70mm/px · 4 of 23 slices shown (1 of 2)]
[im 1/23]
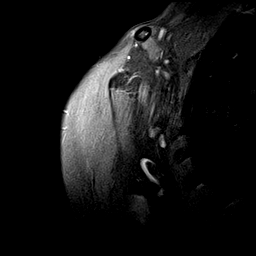
[im 8/23]
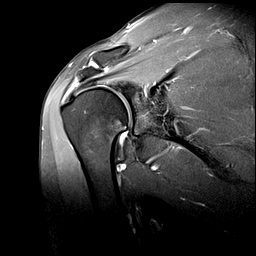
[im 15/23]
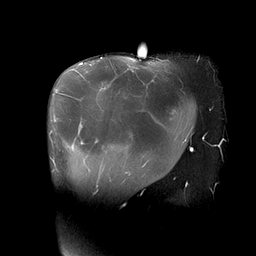
[im 23/23]
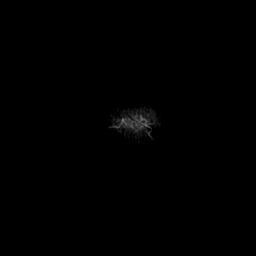

[Series 12: T1 fat-sat · oblique · 4.0mm · 0.70mm/px · 4 of 23 slices shown (2 of 2)]
[im 1/23]
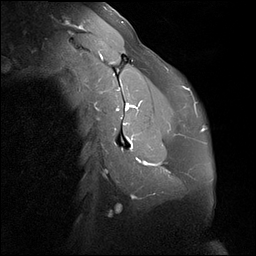
[im 8/23]
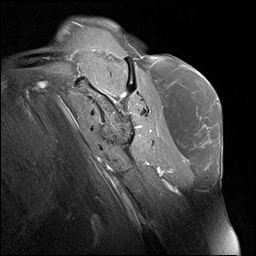
[im 15/23]
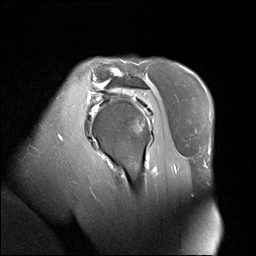
[im 23/23]
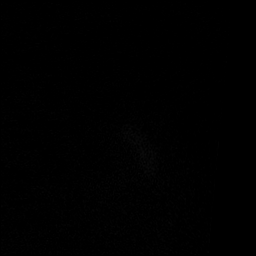

[40 of 40 positions shown; findings below may reference images not displayed]

FINDINGS: There is a well encapsulated, thinly septated subcutaneous fatty
mass measuring 9.5 x 3.8 x 8 cm overlying the posterolateral aspect
of the shoulder. Findings follow fat attenuation on all sequences
and demonstrates no significant enhancement. No soft tissue
nodularity or thickened septa greater than 2 mm in thickness are
identified. No abnormal fluid collections are seen.

Rotator cuff:  Intact rotator cuff.

Muscles: No atrophy or abnormal signal of the muscles of the rotator
cuff.

Biceps long head:  Intact.

Acromioclavicular Joint: Mild arthropathy of the acromioclavicular
joint. Type I acromion. No subacromial/subdeltoid bursal fluid.
Trace subdeltoid bursal edema.

Glenohumeral Joint: No joint effusion. No chondral defect.

Labrum: Grossly intact, but evaluation is limited by lack of
intraarticular fluid.

Bones: Degenerative subcortical cysts along the posterior humeral
head.

Other: None
IMPRESSION: 1. 9.5 x 3.8 x 8 cm well encapsulated thinly septated fatty mass
overlying the posterolateral aspect of the left shoulder consistent
with a lipoma. No appear soft tissue nodularity or thickened
septations to suggest a liposarcoma.
2. Mild subdeltoid bursal edema.  Intact rotator cuff and labrum.

## 2019-02-24 ENCOUNTER — Ambulatory Visit
Admission: EM | Admit: 2019-02-24 | Discharge: 2019-02-24 | Disposition: A | Discharge status: Home / Self Care | Payer: BC Managed Care – PPO | Attending: Family Medicine | Admitting: Family Medicine

## 2019-02-24 ENCOUNTER — Other Ambulatory Visit: Payer: Self-pay

## 2019-02-24 DIAGNOSIS — L03113 Cellulitis of right upper limb: Secondary | ICD-10-CM

## 2019-02-24 MED ORDER — TRIAMCINOLONE ACETONIDE 0.1 % EX CREA: 1.0000 "application " | TOPICAL_CREAM | Freq: Two times a day (BID) | CUTANEOUS | 0 refills | Status: DC

## 2019-02-24 MED ORDER — DOXYCYCLINE HYCLATE 100 MG PO TABS: 100.0000 mg | ORAL_TABLET | Freq: Two times a day (BID) | ORAL | 0 refills | Status: DC

## 2019-02-24 NOTE — ED Triage Notes (Signed)
Pt c/o unknown bug bite to rt thumb, redness and heat noted

## 2019-02-24 NOTE — ED Provider Notes (Signed)
[name]    CSN: [id number] Arrival date & time: 02/24/19  1737     History   Chief Complaint Chief Complaint  Patient presents with  . Insect Bite    HPI Paula Ramirez is a 58 y.o. female.   Pt c/o unknown bug bite to rt thumb, redness and heat noted.  Symptoms began last 24 hours  Initial visit      Past Medical History:  Diagnosis Date  . Back pain   . Hyperglycemia   . Lyme disease     There are no active problems to display for this patient.   Past Surgical History:  Procedure Laterality Date  . BACK SURGERY      OB History   No obstetric history on file.      Home Medications    Prior to Admission medications   Medication Sig Start Date End Date Taking? Authorizing Provider  b complex vitamins tablet Take 1 tablet by mouth daily.    [provider]  doxycycline (VIBRA-TABS) 100 MG tablet Take 1 tablet (100 mg total) by mouth 2 (two) times daily. 02/24/19   Elvina SidleLauenstein, Morgen Ritacco, MD  hydrOXYzine (ATARAX/VISTARIL) 50 MG tablet Take 1 tablet (50 mg total) by mouth 3 (three) times daily as needed. 08/14/18   Domenick GongMortenson, Ashley, MD  Multiple Vitamin (MULTIVITAMIN) tablet Take 1 tablet by mouth daily.    [provider]  predniSONE (STERAPRED UNI-PAK 21 TAB) 10 MG (21) TBPK tablet Dispense one 6 day pack. Take as directed with food. 08/14/18   Domenick GongMortenson, Ashley, MD  triamcinolone cream (KENALOG) 0.1 % Apply 1 application topically 2 (two) times daily. 02/24/19   Elvina SidleLauenstein, Ronette Hank, MD    Family History Family History  Problem Relation Age of Onset  . Hypertension Mother   . Diabetes Mother     Social History Social History   Tobacco Use  . Smoking status: Never Smoker  . Smokeless tobacco: Never Used  Substance Use Topics  . Alcohol use: No  . Drug use: Never     Allergies   Celebrex [celecoxib]   Review of Systems Review of Systems  Skin: Positive for rash.  All other systems reviewed and are negative.     Physical Exam Triage Vital Signs ED Triage Vitals  Enc Vitals Group     BP 02/24/19 1749 128/82     Pulse Rate 02/24/19 1749 78     Resp 02/24/19 1749 18     Temp 02/24/19 1749 98.3 F (36.8 C)     Temp Source 02/24/19 1749 Oral     SpO2 02/24/19 1749 96 %     Weight --      Height --      Head Circumference --      Peak Flow --      Pain Score 02/24/19 1750 0     Pain Loc --      Pain Edu? --      Excl. in GC? --    No data found.  Updated Vital Signs BP 128/82 (BP Location: Left Arm)   Pulse 78   Temp 98.3 F (36.8 C) (Oral)   Resp 18   SpO2 96%    Physical Exam Vitals signs and nursing note reviewed.  Constitutional:      Appearance: Normal appearance.  HENT:     Head: Normocephalic.  Eyes:     Conjunctiva/sclera: Conjunctivae normal.  Neck:     Musculoskeletal: Normal range of motion and neck  supple.  Pulmonary:     Effort: Pulmonary effort is normal.  Skin:    General: Skin is warm and dry.     Findings: Rash present.  Neurological:     General: No focal deficit present.     Mental Status: She is alert.  Psychiatric:        Mood and Affect: Mood normal.        UC Treatments / Results  Labs (all labs ordered are listed, but only abnormal results are displayed) Labs Reviewed - No data to display  EKG None  Radiology No results found.  Procedures Procedures (including critical care time)  Medications Ordered in UC Medications - No data to display  Initial Impression / Assessment and Plan / UC Course  I have reviewed the triage vital signs and the nursing notes.  Pertinent labs & imaging results that were available during my care of the patient were reviewed by me and considered in my medical decision making (see chart for details).     Final Clinical Impressions(s) / UC Diagnoses   Final diagnoses:  Cellulitis of right upper extremity   Discharge Instructions   None    ED Prescriptions    Medication Sig Dispense Auth.  Provider   doxycycline (VIBRA-TABS) 100 MG tablet Take 1 tablet (100 mg total) by mouth 2 (two) times daily. 14 tablet Robyn Haber, MD   triamcinolone cream (KENALOG) 0.1 % Apply 1 application topically 2 (two) times daily. 30 g Robyn Haber, MD     Controlled Substance Prescriptions [place] Controlled Substance Registry consulted? Not Applicable   Robyn Haber, MD 02/24/19 [phone]

## 2019-06-04 DIAGNOSIS — M5412 Radiculopathy, cervical region: Secondary | ICD-10-CM | POA: Insufficient documentation

## 2020-08-03 ENCOUNTER — Ambulatory Visit
Admission: EM | Admit: 2020-08-03 | Discharge: 2020-08-03 | Disposition: A | Discharge status: Home / Self Care | Payer: BC Managed Care – PPO | Attending: Family Medicine | Admitting: Family Medicine

## 2020-08-03 ENCOUNTER — Other Ambulatory Visit: Payer: Self-pay

## 2020-08-03 DIAGNOSIS — J069 Acute upper respiratory infection, unspecified: Secondary | ICD-10-CM

## 2020-08-03 DIAGNOSIS — Z20822 Contact with and (suspected) exposure to covid-19: Secondary | ICD-10-CM

## 2020-08-03 LAB — POCT RAPID STREP A (OFFICE): Rapid Strep A Screen: NEGATIVE

## 2020-08-03 NOTE — ED Provider Notes (Signed)
[name]    CSN: [id number] Arrival date & time: 08/03/20  1651      History   Chief Complaint Chief Complaint  Patient presents with  . Nasal Congestion    HPI Paula Ramirez is a 59 y.o. female. she is presenting with sore throat, chills and body aches. Ongoing since Monday. Her granddaughter has been diagnosed with strep throat. No fevers.   HPI  Past Medical History:  Diagnosis Date  . Back pain   . Hyperglycemia   . Lyme disease     There are no problems to display for this patient.   Past Surgical History:  Procedure Laterality Date  . BACK SURGERY      OB History   No obstetric history on file.      Home Medications    Prior to Admission medications   Medication Sig Start Date End Date Taking? Authorizing Provider  b complex vitamins tablet Take 1 tablet by mouth daily.    [provider]  Multiple Vitamin (MULTIVITAMIN) tablet Take 1 tablet by mouth daily.    [provider]    Family History Family History  Problem Relation Age of Onset  . Hypertension Mother   . Diabetes Mother     Social History Social History   Tobacco Use  . Smoking status: Never Smoker  . Smokeless tobacco: Never Used  Vaping Use  . Vaping Use: Never used  Substance Use Topics  . Alcohol use: No  . Drug use: Never     Allergies   Celebrex [celecoxib]   Review of Systems Review of Systems  See HPI  Physical Exam Triage Vital Signs ED Triage Vitals  Enc Vitals Group     BP 08/03/20 1720 (!) 141/73     Pulse Rate 08/03/20 1720 70     Resp 08/03/20 1720 18     Temp 08/03/20 1720 98.5 F (36.9 C)     Temp Source 08/03/20 1720 Oral     SpO2 08/03/20 1720 98 %     Weight --      Height --      Head Circumference --      Peak Flow --      Pain Score 08/03/20 1721 0     Pain Loc --      Pain Edu? --      Excl. in GC? --    No data found.  Updated Vital Signs BP (!) 141/73 (BP Location: Left Arm)   Pulse 70    Temp 98.5 F (36.9 C) (Oral)   Resp 18   SpO2 98%   Visual Acuity Right Eye Distance:   Left Eye Distance:   Bilateral Distance:    Right Eye Near:   Left Eye Near:    Bilateral Near:     Physical Exam Gen: NAD, alert, cooperative with exam, well-appearing ENT: normal lips, normal nasal mucosa, tympanic membranes clear and intact bilaterally,  Cobblestoning present  Eye: normal EOM, normal conjunctiva and lids CV: , regular rate and rhythm, S1-S2   Resp: no accessory muscle use, non-labored, clear to auscultation bilaterally, no crackles or wheezes  Skin: no rashes, no areas of induration  Neuro: normal tone, normal sensation to touch Psych:  normal insight, alert and oriented MSK: Normal gait, normal strength    UC Treatments / Results  Labs (all labs ordered are listed, but only abnormal results are displayed) Labs Reviewed  NOVEL CORONAVIRUS, NAA  POCT RAPID STREP A (  OFFICE)    EKG   Radiology No results found.  Procedures Procedures (including critical care time)  Medications Ordered in UC Medications - No data to display  Initial Impression / Assessment and Plan / UC Course  I have reviewed the triage vital signs and the nursing notes.  Pertinent labs & imaging results that were available during my care of the patient were reviewed by me and considered in my medical decision making (see chart for details).     Ms. Mahmood is a 58 yo F that is presenting with a viral illness. Less likely for covid or flu. Rapid strep was negative.  Covid swab was obtained.  Counseled on supportive care.  Given indications on follow-up.  Final Clinical Impressions(s) / UC Diagnoses   Final diagnoses:  Encounter for screening laboratory testing for COVID-19 virus  Acute upper respiratory infection     Discharge Instructions     Please try things such as zyrtec-D or allegra-D which is an antihistamine and decongestant.  Please try afrin which will help with nasal  congestion but use for only three days.  Please also try using a netti pot on a regular occasion. Please try honey, vick's vapor rub, lozenges and humidifer for cough and sore throat   Please follow up if your symptoms fail to improve.      ED Prescriptions    None     PDMP not reviewed this encounter.   Myra Rude, MD 08/03/20 [phone]

## 2020-08-03 NOTE — Discharge Instructions (Signed)
Please try things such as zyrtec-D or allegra-D which is an antihistamine and decongestant.  Please try afrin which will help with nasal congestion but use for only three days.  Please also try using a netti pot on a regular occasion. Please try honey, vick's vapor rub, lozenges and humidifer for cough and sore throat   Please follow up if your symptoms fail to improve.  

## 2020-08-03 NOTE — ED Triage Notes (Signed)
Pt c/o cough, sore throat, sneezing, nasal congestion, and body aches since Monday. States her granddaughter has strep. Pt requesting strep and covid testing.

## 2020-08-04 LAB — NOVEL CORONAVIRUS, NAA: SARS-CoV-2, NAA: NOT DETECTED

## 2020-08-04 LAB — SARS-COV-2, NAA 2 DAY TAT

## 2020-08-05 LAB — CULTURE, GROUP A STREP (THRC)

## 2020-08-06 LAB — CULTURE, GROUP A STREP (THRC)

## 2020-08-07 LAB — CULTURE, GROUP A STREP (THRC)

## 2021-02-20 ENCOUNTER — Other Ambulatory Visit: Payer: Self-pay

## 2021-02-20 ENCOUNTER — Ambulatory Visit
Admission: RE | Admit: 2021-02-20 | Discharge: 2021-02-20 | Disposition: A | Discharge status: Home / Self Care | Payer: BC Managed Care – PPO | Source: Ambulatory Visit

## 2021-02-20 VITALS — BP 134/84 | HR 66 | Temp 98.4°F | Resp 18

## 2021-02-20 DIAGNOSIS — W57XXXA Bitten or stung by nonvenomous insect and other nonvenomous arthropods, initial encounter: Secondary | ICD-10-CM | POA: Diagnosis not present

## 2021-02-20 DIAGNOSIS — R519 Headache, unspecified: Secondary | ICD-10-CM

## 2021-02-20 DIAGNOSIS — S30860A Insect bite (nonvenomous) of lower back and pelvis, initial encounter: Secondary | ICD-10-CM

## 2021-02-20 MED ORDER — TRIAMCINOLONE ACETONIDE 0.1 % EX CREA: 1.0000 "application " | TOPICAL_CREAM | Freq: Two times a day (BID) | CUTANEOUS | 0 refills | Status: DC

## 2021-02-20 MED ORDER — DOXYCYCLINE HYCLATE 100 MG PO CAPS: 100.0000 mg | ORAL_CAPSULE | Freq: Two times a day (BID) | ORAL | 0 refills | Status: AC

## 2021-02-20 NOTE — Discharge Instructions (Addendum)
Blood work pending to screen for [employer] spotted fever, Lyme disease Begin doxycycline twice daily x 10 days Triamcinolone twice daily to areas of bites to help with itching, local inflammation Tylenol and ibuprofen for headaches Follow-up if not improving or worsening

## 2021-02-20 NOTE — ED Triage Notes (Signed)
Approx 2 weeks ago, Pt found ticks on her upper right back area and right trunk. She c/o today of itchiness and pain. Onset this week of intermittent HA. Has been taking Tylenol without a decrease in sxs. Denies n/v/d. Pt has a h/o lyme disease.

## 2021-02-23 LAB — SPECIMEN STATUS REPORT

## 2021-02-23 LAB — LYME DISEASE SEROLOGY W/REFLEX: Lyme Total Antibody EIA: NEGATIVE

## 2021-02-23 NOTE — ED Provider Notes (Signed)
[name]    CSN: [id number] Arrival date & time: 02/20/21  1748      History   Chief Complaint Chief Complaint  Patient presents with   Insect Bite    HPI Paula Ramirez is a 60 y.o. female presenting today for evaluation of tick bite.  Reports that she was bit by a tick approximately 1 to 2 weeks ago to her right upper back.  Reports over the past few days she has had increased itchiness and pain to the area as well as associated headaches.  Denies URI symptoms.  Denies nausea vomiting diarrhea.  Denies other rashes.  Reports history of prior Lyme disease and reports similar symptoms.  HPI  Past Medical History:  Diagnosis Date   Back pain    Hyperglycemia    Lyme disease     There are no problems to display for this patient.   Past Surgical History:  Procedure Laterality Date   BACK SURGERY      OB History   No obstetric history on file.      Home Medications    Prior to Admission medications   Medication Sig Start Date End Date Taking? Authorizing Provider  doxycycline (VIBRAMYCIN) 100 MG capsule Take 1 capsule (100 mg total) by mouth 2 (two) times daily for 10 days. 02/20/21 03/02/21 Yes Kalianne Fetting C, PA-C  triamcinolone cream (KENALOG) 0.1 % Apply 1 application topically 2 (two) times daily. 02/20/21  Yes Himmat Enberg C, PA-C  amoxicillin (AMOXIL) 500 MG capsule Take by mouth. 02/08/21   [provider]  b complex vitamins tablet Take 1 tablet by mouth daily.    [provider]  Multiple Vitamin (MULTIVITAMIN) tablet Take 1 tablet by mouth daily.    [provider]    Family History Family History  Problem Relation Age of Onset   Hypertension Mother    Diabetes Mother     Social History Social History   Tobacco Use   Smoking status: Never   Smokeless tobacco: Never  Vaping Use   Vaping Use: Never used  Substance Use Topics   Alcohol use: No   Drug use: Never     Allergies   Celebrex  [celecoxib]   Review of Systems Review of Systems  Constitutional:  Negative for fatigue and fever.  HENT:  Negative for mouth sores.   Eyes:  Negative for visual disturbance.  Respiratory:  Negative for shortness of breath.   Cardiovascular:  Negative for chest pain.  Gastrointestinal:  Negative for abdominal pain, nausea and vomiting.  Genitourinary:  Negative for genital sores.  Musculoskeletal:  Negative for arthralgias and joint swelling.  Skin:  Positive for color change. Negative for rash and wound.  Neurological:  Positive for headaches. Negative for dizziness, weakness and light-headedness.    Physical Exam Triage Vital Signs ED Triage Vitals  Enc Vitals Group     BP 02/20/21 1819 134/84     Pulse Rate 02/20/21 1819 66     Resp 02/20/21 1819 18     Temp 02/20/21 1819 98.4 F (36.9 C)     Temp Source 02/20/21 1819 Oral     SpO2 02/20/21 1819 96 %     Weight --      Height --      Head Circumference --      Peak Flow --      Pain Score 02/20/21 1822 2     Pain Loc --      Pain  Edu? --      Excl. in GC? --    No data found.  Updated Vital Signs BP 134/84 (BP Location: Left Arm)   Pulse 66   Temp 98.4 F (36.9 C) (Oral)   Resp 18   SpO2 96%   Visual Acuity Right Eye Distance:   Left Eye Distance:   Bilateral Distance:    Right Eye Near:   Left Eye Near:    Bilateral Near:     Physical Exam Vitals and nursing note reviewed.  Constitutional:      Appearance: She is well-developed.     Comments: No acute distress  HENT:     Head: Normocephalic and atraumatic.     Nose: Nose normal.  Eyes:     Conjunctiva/sclera: Conjunctivae normal.  Cardiovascular:     Rate and Rhythm: Normal rate and regular rhythm.  Pulmonary:     Effort: Pulmonary effort is normal. No respiratory distress.     Comments: Breathing comfortably at rest, CTABL, no wheezing, rales or other adventitious sounds auscultated  Abdominal:     General: There is no distension.   Musculoskeletal:        General: Normal range of motion.     Cervical back: Neck supple.  Skin:    General: Skin is warm and dry.     Comments: Small area of erythema noted to left upper back without significant surrounding warmth  No other rashes noted to skin  Neurological:     Mental Status: She is alert and oriented to person, place, and time.     UC Treatments / Results  Labs (all labs ordered are listed, but only abnormal results are displayed) Labs Reviewed  ROCKY MTN SPOTTED FVR ABS PNL(IGG+IGM)  B. BURGDORFI ANTIBODIES  CBC WITH DIFFERENTIAL/PLATELET    EKG   Radiology No results found.  Procedures Procedures (including critical care time)  Medications Ordered in UC Medications - No data to display  Initial Impression / Assessment and Plan / UC Course  I have reviewed the triage vital signs and the nursing notes.  Pertinent labs & imaging results that were available during my care of the patient were reviewed by me and considered in my medical decision making (see chart for details).     Recent tick bite-checking blood work for RMSF and Lyme, initiating on Doxy x10 days, if antibodies positive we will extend to a 21 day course, triamcinolone topically to areas of wounds and recommend anti-inflammatories as needed for headaches.  Discussed strict return precautions. Patient verbalized understanding and is agreeable with plan.  Final Clinical Impressions(s) / UC Diagnoses   Final diagnoses:  Tick bite of back, initial encounter  Acute nonintractable headache, unspecified headache type     Discharge Instructions      Blood work pending to screen for [facility] spotted fever, Lyme disease Begin doxycycline twice daily x 10 days Triamcinolone twice daily to areas of bites to help with itching, local inflammation Tylenol and ibuprofen for headaches Follow-up if not improving or worsening     ED Prescriptions     Medication Sig Dispense Auth.  Provider   doxycycline (VIBRAMYCIN) 100 MG capsule Take 1 capsule (100 mg total) by mouth 2 (two) times daily for 10 days. 20 capsule Goodwin Kamphaus C, PA-C   triamcinolone cream (KENALOG) 0.1 % Apply 1 application topically 2 (two) times daily. 30 g Geanine Vandekamp, [place] C, PA-C      PDMP not reviewed this encounter.   Norena Bratton, [employer]  C, PA-C 02/23/21 [id number]

## 2021-02-24 LAB — CBC WITH DIFFERENTIAL/PLATELET
Basophils Absolute: 0 10*3/uL (ref 0.0–0.2)
Basos: 1 %
EOS (ABSOLUTE): 0.1 10*3/uL (ref 0.0–0.4)
Eos: 1 %
Hematocrit: 39.5 % (ref 34.0–46.6)
Hemoglobin: 13.4 g/dL (ref 11.1–15.9)
Immature Grans (Abs): 0 10*3/uL (ref 0.0–0.1)
Immature Granulocytes: 0 %
Lymphocytes Absolute: 2.6 10*3/uL (ref 0.7–3.1)
Lymphs: 35 %
MCH: 30 pg (ref 26.6–33.0)
MCHC: 33.9 g/dL (ref 31.5–35.7)
MCV: 88 fL (ref 79–97)
Monocytes Absolute: 0.4 10*3/uL (ref 0.1–0.9)
Monocytes: 6 %
Neutrophils Absolute: 4.2 10*3/uL (ref 1.4–7.0)
Neutrophils: 57 %
Platelets: 195 10*3/uL (ref 150–450)
RBC: 4.47 x10E6/uL (ref 3.77–5.28)
RDW: 12.5 % (ref 11.7–15.4)
WBC: 7.3 10*3/uL (ref 3.4–10.8)

## 2021-02-24 LAB — ROCKY MTN SPOTTED FVR ABS PNL(IGG+IGM)
RMSF IgG: NEGATIVE
RMSF IgM: 0.73 index (ref 0.00–0.89)

## 2021-09-19 ENCOUNTER — Ambulatory Visit
Admission: EM | Admit: 2021-09-19 | Discharge: 2021-09-19 | Disposition: A | Discharge status: Home / Self Care | Payer: No Typology Code available for payment source | Attending: Internal Medicine | Admitting: Internal Medicine

## 2021-09-19 ENCOUNTER — Ambulatory Visit (INDEPENDENT_AMBULATORY_CARE_PROVIDER_SITE_OTHER): Payer: No Typology Code available for payment source

## 2021-09-19 ENCOUNTER — Other Ambulatory Visit: Payer: Self-pay

## 2021-09-19 DIAGNOSIS — R053 Chronic cough: Secondary | ICD-10-CM | POA: Diagnosis not present

## 2021-09-19 DIAGNOSIS — R059 Cough, unspecified: Secondary | ICD-10-CM

## 2021-09-19 MED ORDER — BENZONATATE 100 MG PO CAPS: 100.0000 mg | ORAL_CAPSULE | Freq: Three times a day (TID) | ORAL | 0 refills | Status: DC | PRN

## 2021-09-19 MED ORDER — PREDNISONE 10 MG (21) PO TBPK: ORAL_TABLET | Freq: Every day | ORAL | 0 refills | Status: DC

## 2021-09-19 MED ORDER — ALBUTEROL SULFATE HFA 108 (90 BASE) MCG/ACT IN AERS: 1.0000 | INHALATION_SPRAY | Freq: Four times a day (QID) | RESPIRATORY_TRACT | 0 refills | Status: DC | PRN

## 2021-09-19 NOTE — ED Provider Notes (Signed)
[name]    CSN: [id number] Arrival date & time: 09/19/21  1640      History   Chief Complaint Chief Complaint  Patient presents with   Cough    HPI Paula Ramirez is a 61 y.o. female.   Patient presents with intermittent nonproductive cough that has been present since October 2022.  Denies any upper respiratory symptoms.  Patient does report that she did have some upper respiratory symptoms in October when symptoms first started that have now resolved.  She has been taking over-the-counter cough medication with no improvement in symptoms.  Denies chest pain or shortness of breath.  Denies any known fevers.  Denies history of any chronic lung diseases such as asthma or COPD. She does report that she notices that when there is cold weather or rainy weather, the cough is more severe.   Cough  Past Medical History:  Diagnosis Date   Back pain    Hyperglycemia    Lyme disease     There are no problems to display for this patient.   Past Surgical History:  Procedure Laterality Date   BACK SURGERY      OB History   No obstetric history on file.      Home Medications    Prior to Admission medications   Medication Sig Start Date End Date Taking? Authorizing Provider  albuterol (VENTOLIN HFA) 108 (90 Base) MCG/ACT inhaler Inhale 1-2 puffs into the lungs every 6 (six) hours as needed for wheezing or shortness of breath. 09/19/21  Yes Sawsan Riggio, Hildred Alamin E, FNP  benzonatate (TESSALON) 100 MG capsule Take 1 capsule (100 mg total) by mouth every 8 (eight) hours as needed for cough. 09/19/21  Yes Kevron Patella, Hildred Alamin E, FNP  predniSONE (STERAPRED UNI-PAK 21 TAB) 10 MG (21) TBPK tablet Take by mouth daily. Take 6 tabs by mouth daily  for 2 days, then 5 tabs for 2 days, then 4 tabs for 2 days, then 3 tabs for 2 days, 2 tabs for 2 days, then 1 tab by mouth daily for 2 days 09/19/21  Yes [place], [place] E, FNP  amoxicillin (AMOXIL) 500 MG capsule Take by mouth. 02/08/21   [provider]  b complex vitamins tablet Take 1 tablet by mouth daily.    [provider]  Multiple Vitamin (MULTIVITAMIN) tablet Take 1 tablet by mouth daily.    [provider]  triamcinolone cream (KENALOG) 0.1 % Apply 1 application topically 2 (two) times daily. 02/20/21   Wieters, Elesa Hacker, PA-C    Family History Family History  Problem Relation Age of Onset   Hypertension Mother    Diabetes Mother     Social History Social History   Tobacco Use   Smoking status: Never   Smokeless tobacco: Never  Vaping Use   Vaping Use: Never used  Substance Use Topics   Alcohol use: No   Drug use: Never     Allergies   Celebrex [celecoxib]   Review of Systems Review of Systems Per HPI  Physical Exam Triage Vital Signs ED Triage Vitals  Enc Vitals Group     BP 09/19/21 1813 138/71     Pulse Rate 09/19/21 1813 77     Resp 09/19/21 1813 18     Temp 09/19/21 1813 98 F (36.7 C)     Temp Source 09/19/21 1813 Oral     SpO2 09/19/21 1813 96 %     Weight --      Height --  Head Circumference --      Peak Flow --      Pain Score 09/19/21 1816 0     Pain Loc --      Pain Edu? --      Excl. in [place]? --    No data found.  Updated Vital Signs BP 138/71 (BP Location: Left Arm)    Pulse 77    Temp 98 F (36.7 C) (Oral)    Resp 18    SpO2 96%   Visual Acuity Right Eye Distance:   Left Eye Distance:   Bilateral Distance:    Right Eye Near:   Left Eye Near:    Bilateral Near:     Physical Exam Constitutional:      General: She is not in acute distress.    Appearance: Normal appearance. She is not toxic-appearing or diaphoretic.  HENT:     Head: Normocephalic and atraumatic.     Right Ear: Tympanic membrane and ear canal normal.     Left Ear: Tympanic membrane and ear canal normal.     Nose: Nose normal.     Mouth/Throat:     Mouth: Mucous membranes are moist.     Pharynx: No posterior oropharyngeal erythema.  Eyes:     Extraocular Movements:  Extraocular movements intact.     Conjunctiva/sclera: Conjunctivae normal.     Pupils: Pupils are equal, round, and reactive to light.  Cardiovascular:     Rate and Rhythm: Normal rate and regular rhythm.     Pulses: Normal pulses.     Heart sounds: Normal heart sounds.  Pulmonary:     Effort: Pulmonary effort is normal. No respiratory distress.     Breath sounds: Normal breath sounds. No stridor. No wheezing, rhonchi or rales.  Neurological:     General: No focal deficit present.     Mental Status: She is alert and oriented to person, place, and time. Mental status is at baseline.  Psychiatric:        Mood and Affect: Mood normal.        Behavior: Behavior normal.        Thought Content: Thought content normal.        Judgment: Judgment normal.     UC Treatments / Results  Labs (all labs ordered are listed, but only abnormal results are displayed) Labs Reviewed - No data to display  EKG   Radiology DG Chest 2 View  Result Date: 09/19/2021 CLINICAL DATA:  Intermittent cough for 3 months EXAM: CHEST - 2 VIEW COMPARISON:  10/03/2004 FINDINGS: Frontal and lateral views of the chest demonstrate an unremarkable cardiac silhouette. No acute airspace disease, effusion, or pneumothorax. Perihilar bronchovascular prominence could reflect reactive airway disease. No acute bony abnormalities. IMPRESSION: 1. Bilateral perihilar bronchovascular prominence, which could reflect reactive airway disease. 2. No evidence of pneumonia. Electronically Signed   By: Randa Ngo M.D.   On: 09/19/2021 19:12    Procedures Procedures (including critical care time)  Medications Ordered in UC Medications - No data to display  Initial Impression / Assessment and Plan / UC Course  I have reviewed the triage vital signs and the nursing notes.  Pertinent labs & imaging results that were available during my care of the patient were reviewed by me and considered in my medical decision making (see chart for  details).     Chest x-ray showing signs of reactive airway disease.  Will treat with prednisone steroid taper  to  help alleviate harsh  cough noted on exam.  Patient will need to follow-up with pulmonology for further evaluation and management.  Albuterol inhaler prescribed for patient to take as needed for shortness of breath if that develops.  Benzonatate prescribed to take as needed for cough.  Discussed return precautions.  Patient verbalized understanding and was agreeable with plan. Final Clinical Impressions(s) / UC Diagnoses   Final diagnoses:  Persistent cough for 3 weeks or longer     Discharge Instructions      You have been prescribed a few medications to help alleviate your symptoms.  Please follow-up with pulmonologist with provided contact information for further evaluation and management.     ED Prescriptions     Medication Sig Dispense Auth. Provider   predniSONE (STERAPRED UNI-PAK 21 TAB) 10 MG (21) TBPK tablet Take by mouth daily. Take 6 tabs by mouth daily  for 2 days, then 5 tabs for 2 days, then 4 tabs for 2 days, then 3 tabs for 2 days, 2 tabs for 2 days, then 1 tab by mouth daily for 2 days 42 tablet Chau Sawin, [place] E, [place]   benzonatate (TESSALON) 100 MG capsule Take 1 capsule (100 mg total) by mouth every 8 (eight) hours as needed for cough. 21 capsule [place], [place] E, [place]   albuterol (VENTOLIN HFA) 108 (90 Base) MCG/ACT inhaler Inhale 1-2 puffs into the lungs every 6 (six) hours as needed for wheezing or shortness of breath. 1 each Teodora Medici, [place]      PDMP not reviewed this encounter.   Teodora Medici,  09/19/21 1952

## 2021-09-19 NOTE — Discharge Instructions (Signed)
You have been prescribed a few medications to help alleviate your symptoms.  Please follow-up with pulmonologist with provided contact information for further evaluation and management.

## 2021-09-19 NOTE — ED Triage Notes (Signed)
Pt complains of waxing and waning cough that started in October 2022. She c/o coughing spells that take her breath away and/or make her feel like she needs to vomit. Has been taking robitussin. Notes some congestion. Pt feels like she coughs more when the weather is damp.  Denies sore throat.

## 2021-10-11 DIAGNOSIS — R051 Acute cough: Secondary | ICD-10-CM | POA: Insufficient documentation

## 2021-10-17 ENCOUNTER — Encounter: Payer: Self-pay | Admitting: Internal Medicine

## 2021-10-17 ENCOUNTER — Other Ambulatory Visit: Payer: Self-pay

## 2021-10-17 ENCOUNTER — Ambulatory Visit (INDEPENDENT_AMBULATORY_CARE_PROVIDER_SITE_OTHER): Payer: No Typology Code available for payment source | Admitting: Internal Medicine

## 2021-10-17 ENCOUNTER — Other Ambulatory Visit: Payer: Self-pay | Admitting: Internal Medicine

## 2021-10-17 VITALS — BP 138/80 | HR 74 | Temp 97.6°F | Ht 63.0 in | Wt 152.0 lb

## 2021-10-17 DIAGNOSIS — R0602 Shortness of breath: Secondary | ICD-10-CM | POA: Diagnosis not present

## 2021-10-17 DIAGNOSIS — R058 Other specified cough: Secondary | ICD-10-CM | POA: Diagnosis not present

## 2021-10-17 MED ORDER — ALBUTEROL SULFATE HFA 108 (90 BASE) MCG/ACT IN AERS: 1.0000 | INHALATION_SPRAY | Freq: Four times a day (QID) | RESPIRATORY_TRACT | 0 refills | Status: AC | PRN

## 2021-10-17 MED ORDER — CHLORPHENIRAMINE MALEATE 4 MG PO TABS: 4.0000 mg | ORAL_TABLET | Freq: Two times a day (BID) | ORAL | 0 refills | Status: DC | PRN

## 2021-10-17 NOTE — Progress Notes (Signed)
Paula Ramirez    [id number]    1960/09/26  Primary Care Physician:Medicine, [facility]  Referring Physician: Medicine, [facility] No address on file Reason for Consultation: cough Date of Consultation: 10/18/2021  Chief complaint:   Chief Complaint  Patient presents with   Follow-up    Cough since Oct 2022, Covid      HPI: Paula Ramirez is a 61 y.o. woman who presents for new patient evaluation of cough and shortness of breath. Cough started in October. And she is also having shortness of breath with exertion, going up stairs. No chest tightness, wheezing. Cough is dry, not waking her up in sleep. Occurs during the day.   She notes her voice is more hoarse than normal for her.   She was prescribed an inhaler but hasn't taken it.   She usually has one URI every year. No prolonged symptoms.   No childhood respiratory disease, recurrent bronchitis.   She has frequent throat clearing and phlegm stuck in her throat.  No runny nose, itchy watery eyes.  Denies reflux  Coughing does not necessarily make the coughing worse.   Symptoms did not improve with prednisone and amoxicillin. She does think the tessalon capsules are helping.    Social history:  Occupation: works in [employer] Exposures: lives at home with husband, son, no pets.  Smoking history: never, no passive smoke exposure.   Social History   Occupational History   Not on file  Tobacco Use   Smoking status: Never   Smokeless tobacco: Never  Vaping Use   Vaping Use: Never used  Substance and Sexual Activity   Alcohol use: No   Drug use: Never   Sexual activity: Not on file    Relevant family history:  Family History  Problem Relation Age of Onset   Hypertension Mother    Diabetes Mother     Past Medical History:  Diagnosis Date   Back pain    Hyperglycemia    Lyme disease     Past Surgical History:  Procedure Laterality Date   BACK  SURGERY       Physical Exam: Blood pressure 138/80, pulse 74, temperature 97.6 F (36.4 C), temperature source Oral, height 5\' 3"  (1.6 m), weight 152 lb (68.9 kg), SpO2 99 %. Gen:      No acute distress ENT:  no nasal polyps, mucus membranes moist Lungs:    No increased respiratory effort, symmetric chest wall excursion, clear to auscultation bilaterally, no wheezes or crackles, harsh barking cough, dry CV:         Regular rate and rhythm; no murmurs, rubs, or gallops.  No pedal edema Abd:      + bowel sounds; soft, non-tender; no distension MSK: no acute synovitis of DIP or PIP joints, no mechanics hands.  Skin:      Warm and dry; no rashes Neuro: normal speech, no focal facial asymmetry Psych: alert and oriented x3, normal mood and affect   Data Reviewed/Medical Decision Making:  Independent interpretation of tests: Imaging:  Review of patient's chest xray Sep 19 2021 images revealed no acute cardiopulmonary process. The patient's images have been independently reviewed by me.    PFTs:  No flowsheet data found.  Labs:  Lab Results  Component Value Date   WBC 7.3 02/20/2021   HGB 13.4 02/20/2021   HCT 39.5 02/20/2021   MCV 88 02/20/2021   PLT 195 02/20/2021  Lab Results  Component Value Date   NA 143 11/19/2015   K 3.5 11/19/2015   CL 111 11/19/2015   CO2 21 (L) 11/19/2015     Immunization status:  Immunization History  Administered Date(s) Administered   Influenza,inj,quad, With Preservative 09/02/2017   Tdap 08/01/2011     I reviewed prior external note(s) from PCP, ED, urgent care  I reviewed the result(s) of the labs and imaging as noted above.   I have ordered spiro and feno at next visit   Assessment:  Post Viral Cough Shortness of breath  Plan/Recommendations: Suspect this is severe post viral cough, no improvement with trial of steroids Continue tessalon, will add chloramphermine for post nasal drainage. Recommend vocal rest.  Trial of prn  albuterol for dyspnea. Her cough precludes PFTs today, will get spiro/feno at next visit.    Return to Care: Return in about 4 weeks (around 11/14/2021).  Durel Salts, MD Pulmonary and Critical Care Medicine [facility] Office:[phone]  CC: Medicine, [facility]*

## 2021-10-17 NOTE — Patient Instructions (Signed)
Please schedule follow up scheduled with myself in 1 months.  If my schedule is not open yet, we will contact you with a reminder closer to that time. Please call [phone] if you haven't heard from [place] a month before.   Take chlorpheniramine to help with drainage that is worsening your cough. Continue tessalon capsules to help with cough  Try the albuterol as needed to help with the shortness of breath.  I will see you back in a few weeks to see how cough is doing and we can do breathing testing at that time.   Your chest xray is normal!

## 2021-11-22 ENCOUNTER — Ambulatory Visit: Payer: BC Managed Care – PPO | Admitting: Internal Medicine

## 2022-01-10 ENCOUNTER — Ambulatory Visit (INDEPENDENT_AMBULATORY_CARE_PROVIDER_SITE_OTHER): Payer: No Typology Code available for payment source | Admitting: Internal Medicine

## 2022-01-10 ENCOUNTER — Encounter: Payer: Self-pay | Admitting: Internal Medicine

## 2022-01-10 VITALS — BP 128/74 | HR 69 | Temp 98.1°F | Ht 63.5 in | Wt 152.0 lb

## 2022-01-10 DIAGNOSIS — R058 Other specified cough: Secondary | ICD-10-CM

## 2022-01-10 DIAGNOSIS — R0602 Shortness of breath: Secondary | ICD-10-CM | POA: Diagnosis not present

## 2022-01-10 NOTE — Progress Notes (Signed)
? ?      ?  Paula Ramirez    [id number]    1961-05-02 ? ?Primary Care Physician:Medicine, [place] ?Date of Appointment: 01/10/2022 ?Established Patient Visit ? ?Chief complaint:   ?Chief Complaint  ?Patient presents with  ? Follow-up  ?  Cough improved  ? ? ? ?HPI: ?[name] is a 61 y.o. woman who presentd with post viral cough syndrome ? ?Interval Updates: ?Post viral cough has resolved.  ?She hasn't used albuterol inhaler, doesn't feel it helps ?Still occasional feels short of breath but no limitations with ADLs, and she feels might be related to her weight.  ? ?I have reviewed the patient's family social and past medical history and updated as appropriate.  ? ?Past Medical History:  ?Diagnosis Date  ? Back pain   ? Hyperglycemia   ? Lyme disease   ? ? ?Past Surgical History:  ?Procedure Laterality Date  ? BACK SURGERY    ? ? ?Family History  ?Problem Relation Age of Onset  ? Hypertension Mother   ? Diabetes Mother   ? ? ?Social History  ? ?Occupational History  ? Not on file  ?Tobacco Use  ? Smoking status: Never  ? Smokeless tobacco: Never  ?Vaping Use  ? Vaping Use: Never used  ?Substance and Sexual Activity  ? Alcohol use: No  ? Drug use: Never  ? Sexual activity: Not on file  ? ? ? ?Physical Exam: ?Blood pressure 128/74, pulse 69, temperature 98.1 ?F (36.7 ?C), temperature source Oral, height 5' 3.5" (1.613 m), weight 152 lb (68.9 kg), SpO2 100 %. ? ?Gen:      No acute distress ?ENT:  no nasal polyps, mucus membranes moist ?Lungs:    No increased respiratory effort, symmetric chest wall excursion, clear to auscultation bilaterally, no wheezes or crackles ?CV:         Regular rate and rhythm; no murmurs, rubs, or gallops.  No pedal edema ? ? ?Data Reviewed: ?Imaging: ?I have personally reviewed the Jan 2023 - possible reactive airways disease? But it's a clear chest xray ? ?PFTs: ? ?   ? View : No data to display.  ?  ?  ?  ? ? ?Labs: ? ?Immunization  status: ?Immunization History  ?Administered Date(s) Administered  ? Influenza,inj,quad, With Preservative 09/02/2017  ? Tdap 08/01/2011  ? ? ?External Records Personally Reviewed:  ? ?Assessment:  ?Post viral cough - improved ?Shortness of breath ? ?Plan/Recommendations: ?Will obtain PFTs.  ?Will contact her with results.  ?No improvement with albuterol so lower suspicion for asthma.  ? ?Return to Care: ?Return if symptoms worsen or fail to improve, for shortness of breath. ? ? ?Lenice Llamas, MD ?Pulmonary and Critical Care Medicine ?[facility] ?Office:[phone] ? ? ? ? ? ?

## 2022-01-10 NOTE — Patient Instructions (Signed)
Follow up with me as needed - we will get some breathing testing to make sure your lungs are ok. ? ?Can stop taking inhaler if not beneficial. ? ? ?

## 2022-06-24 ENCOUNTER — Encounter: Payer: Self-pay | Admitting: Internal Medicine

## 2022-06-24 ENCOUNTER — Ambulatory Visit (INDEPENDENT_AMBULATORY_CARE_PROVIDER_SITE_OTHER): Payer: No Typology Code available for payment source | Admitting: Internal Medicine

## 2022-06-24 VITALS — BP 112/74 | HR 78 | Temp 97.9°F | Ht 63.5 in | Wt 145.0 lb

## 2022-06-24 DIAGNOSIS — R0602 Shortness of breath: Secondary | ICD-10-CM

## 2022-06-24 DIAGNOSIS — J329 Chronic sinusitis, unspecified: Secondary | ICD-10-CM | POA: Diagnosis not present

## 2022-06-24 LAB — PULMONARY FUNCTION TEST
DL/VA % pred: 104 %
DL/VA: 4.42 ml/min/mmHg/L
DLCO cor % pred: 86 %
DLCO cor: 17.11 ml/min/mmHg
DLCO unc % pred: 86 %
DLCO unc: 17.11 ml/min/mmHg
FEF 25-75 Post: 1.92 L/sec
FEF 25-75 Pre: 1.66 L/sec
FEF2575-%Change-Post: 15 %
FEF2575-%Pred-Post: 85 %
FEF2575-%Pred-Pre: 74 %
FEV1-%Change-Post: 2 %
FEV1-%Pred-Post: 83 %
FEV1-%Pred-Pre: 81 %
FEV1-Post: 2.04 L
FEV1-Pre: 2 L
FEV1FVC-%Change-Post: 0 %
FEV1FVC-%Pred-Pre: 101 %
FEV6-%Change-Post: 1 %
FEV6-%Pred-Post: 84 %
FEV6-%Pred-Pre: 82 %
FEV6-Post: 2.59 L
FEV6-Pre: 2.54 L
FEV6FVC-%Change-Post: 0 %
FEV6FVC-%Pred-Post: 103 %
FEV6FVC-%Pred-Pre: 103 %
FVC-%Change-Post: 1 %
FVC-%Pred-Post: 81 %
FVC-%Pred-Pre: 79 %
FVC-Post: 2.59 L
FVC-Pre: 2.54 L
Post FEV1/FVC ratio: 79 %
Post FEV6/FVC ratio: 100 %
Pre FEV1/FVC ratio: 79 %
Pre FEV6/FVC Ratio: 100 %
RV % pred: 83 %
RV: 1.68 L
TLC % pred: 90 %
TLC: 4.51 L

## 2022-06-24 NOTE — Progress Notes (Signed)
         Paula Ramirez    [id number]    May 29, 1961  Primary Care Physician:Medicine, [facility] Date of Appointment: 06/24/2022 Established Patient Visit  Chief complaint:   Chief Complaint  Patient presents with   Follow-up    Follow up. Doing well. Hx SOB.     HPI: Paula Ramirez is a 61 y.o. woman who presentd with post viral cough syndrome  Interval Updates: She is here for follow up after PFTs which show normal pulmonary function. Has occasional dyspnea with severe exertion but nothing that limits ADLs. She was mostly just concerned after her son with asthma ended up being diagnosed with endobronchial carcinoid tumor (also my patient.)  Having some sinus congestion and allergies which are worse this time of year  I have reviewed the patient's family social and past medical history and updated as appropriate.   Past Medical History:  Diagnosis Date   Back pain    Hyperglycemia    Lyme disease     Past Surgical History:  Procedure Laterality Date   BACK SURGERY      Family History  Problem Relation Age of Onset   Hypertension Mother    Diabetes Mother     Social History   Occupational History   Not on file  Tobacco Use   Smoking status: Never   Smokeless tobacco: Never  Vaping Use   Vaping Use: Never used  Substance and Sexual Activity   Alcohol use: No   Drug use: Never   Sexual activity: Not on file     Physical Exam: Blood pressure 112/74, pulse 78, temperature 97.9 F (36.6 C), temperature source Oral, height 5' 3.5" (1.613 m), weight 145 lb (65.8 kg), SpO2 100 %.  Gen:      No acute distress Lungs:    ctab no wheezes or crackles CV:         RRR no mrg   Data Reviewed: Imaging: I have personally reviewed the Jan 2023 - clear  PFTs: Normal pulmonary function.     Latest Ref Rng & Units 06/24/2022    2:06 PM  PFT Results  FVC-Pre L 2.54  P  FVC-Predicted Pre % 79  P  FVC-Post L 2.59  P   FVC-Predicted Post % 81  P  Pre FEV1/FVC % % 79  P  Post FEV1/FCV % % 79  P  FEV1-Pre L 2.00  P  FEV1-Predicted Pre % 81  P  FEV1-Post L 2.04  P  DLCO uncorrected ml/min/mmHg 17.11  P  DLCO UNC% % 86  P  DLCO corrected ml/min/mmHg 17.11  P  DLCO COR %Predicted % 86  P  DLVA Predicted % 104  P  TLC L 4.51  P  TLC % Predicted % 90  P  RV % Predicted % 83  P    P Preliminary result    Labs:  Immunization status: Immunization History  Administered Date(s) Administered   Influenza,inj,quad, With Preservative 09/02/2017   Tdap 08/01/2011    External Records Personally Reviewed:   Assessment:  Shortness of breath - resolved Chronic rhinosinusitis  Plan/Recommendations: PFTS show normal pulmonary function Recommend trying flonase and changing anti-histamine for sinus congestion. Come see me again if symptoms don't improve.   Return to Care: No follow-ups on file.   Lenice Llamas, MD Pulmonary and [place]

## 2022-06-24 NOTE — Patient Instructions (Signed)
Full PFT performed today. °

## 2022-06-24 NOTE — Patient Instructions (Signed)
Come see me as needed. I can also help if sinus congestion not improving with over the counter remedies.  Breathing testing totally normal.   Can try switching up your anti-histamine to see if switching from claritin to zyrtec might help sinus congestion.   Flonase - 1 spray on each side of your nose twice a day for first week, then 1 spray on each side.   Instructions for use: If you also use a saline nasal spray or rinse, use that first. Position the head with the chin slightly tucked. Use the right hand to spray into the left nostril and the right hand to spray into the left nostril.   Point the bottle away from the septum of your nose (cartilage that divides the two sides of your nose).  Hold the nostril closed on the opposite side from where you will spray Spray once and gently sniff to pull the medicine into the higher parts of your nose.  Don't sniff too hard as the medicine will drain down the back of your throat instead. Repeat with a second spray on the same side if prescribed. Repeat on the other side of your nose.

## 2022-06-24 NOTE — Progress Notes (Signed)
Full PFT performed today. °

## 2023-02-06 DIAGNOSIS — W57XXXA Bitten or stung by nonvenomous insect and other nonvenomous arthropods, initial encounter: Secondary | ICD-10-CM | POA: Insufficient documentation

## 2023-02-06 DIAGNOSIS — S30860A Insect bite (nonvenomous) of lower back and pelvis, initial encounter: Secondary | ICD-10-CM | POA: Insufficient documentation

## 2023-10-22 ENCOUNTER — Ambulatory Visit
Admission: EM | Admit: 2023-10-22 | Discharge: 2023-10-22 | Disposition: A | Discharge status: Home / Self Care | Payer: No Typology Code available for payment source

## 2023-10-22 DIAGNOSIS — K529 Noninfective gastroenteritis and colitis, unspecified: Secondary | ICD-10-CM

## 2023-10-22 DIAGNOSIS — K625 Hemorrhage of anus and rectum: Secondary | ICD-10-CM | POA: Insufficient documentation

## 2023-10-22 DIAGNOSIS — M5030 Other cervical disc degeneration, unspecified cervical region: Secondary | ICD-10-CM | POA: Insufficient documentation

## 2023-10-22 DIAGNOSIS — Z1211 Encounter for screening for malignant neoplasm of colon: Secondary | ICD-10-CM | POA: Insufficient documentation

## 2023-10-22 DIAGNOSIS — R1111 Vomiting without nausea: Secondary | ICD-10-CM | POA: Insufficient documentation

## 2023-10-22 DIAGNOSIS — K5900 Constipation, unspecified: Secondary | ICD-10-CM | POA: Insufficient documentation

## 2023-10-22 DIAGNOSIS — R748 Abnormal levels of other serum enzymes: Secondary | ICD-10-CM | POA: Insufficient documentation

## 2023-10-22 MED ORDER — ONDANSETRON 4 MG PO TBDP: 4.0000 mg | ORAL_TABLET | Freq: Three times a day (TID) | ORAL | 0 refills | Status: AC | PRN

## 2023-10-22 MED ORDER — ONDANSETRON 4 MG PO TBDP
4.0000 mg | ORAL_TABLET | Freq: Once | ORAL | Status: AC
  Administered 2023-10-22: 4 mg via ORAL

## 2023-10-22 NOTE — ED Provider Notes (Signed)
 [name]    CSN: [id number] Arrival date & time: 10/22/23  1332      History   Chief Complaint Chief Complaint  Patient presents with   Emesis   Diarrhea    HPI Paula Ramirez is a 63 y.o. female.   Patient here today for evaluation of nausea, vomiting and diarrhea that started this morning.  She has not had fever.  She denies any rash.  She has not any congestion or cough.  The history is provided by the patient.  Emesis Associated symptoms: diarrhea   Associated symptoms: no abdominal pain, no chills, no cough, no fever and no sore throat   Diarrhea Associated symptoms: vomiting   Associated symptoms: no abdominal pain, no chills and no fever     Past Medical History:  Diagnosis Date   Back pain    Hyperglycemia    Lyme disease     Patient Active Problem List   Diagnosis Date Noted   Alkaline phosphatase raised 10/22/2023   Constipation 10/22/2023   Colon cancer screening 10/22/2023   DDD (degenerative disc disease), cervical 10/22/2023   Rectal bleeding 10/22/2023   Vomiting without nausea 10/22/2023   Tick bite of back 02/06/2023   Acute cough 10/11/2021   Cervical radiculopathy 06/04/2019   Degenerative disc disease, lumbar 10/14/2017    Past Surgical History:  Procedure Laterality Date   BACK SURGERY      OB History   No obstetric history on file.      Home Medications    Prior to Admission medications   Medication Sig Start Date End Date Taking? Authorizing Provider  ondansetron  (ZOFRAN -ODT) 4 MG disintegrating tablet Take 1 tablet (4 mg total) by mouth every 8 (eight) hours as needed. 10/22/23  Yes Billy Asberry FALCON, PA-C  PEG 3350-[id number] (GOLYTELY PO) See admin instructions. 05/16/23  Yes [provider]  albuterol  (VENTOLIN  HFA) 108 (90 Base) MCG/ACT inhaler Inhale 1-2 puffs into the lungs every 6 (six) hours as needed for wheezing or shortness of breath. 10/17/21   Desai, Nikita S, MD  Multiple  Vitamin (MULTIVITAMIN) tablet Take 1 tablet by mouth daily.    [provider]    Family History Family History  Problem Relation Age of Onset   Hypertension Mother    Diabetes Mother     Social History Social History   Tobacco Use   Smoking status: Never   Smokeless tobacco: Never  Vaping Use   Vaping status: Never Used  Substance Use Topics   Alcohol use: No   Drug use: Never     Allergies   Celecoxib   Review of Systems Review of Systems  Constitutional:  Negative for chills and fever.  HENT:  Negative for congestion and sore throat.   Eyes:  Negative for discharge and redness.  Respiratory:  Negative for cough and shortness of breath.   Gastrointestinal:  Positive for diarrhea, nausea and vomiting. Negative for abdominal pain.     Physical Exam Triage Vital Signs ED Triage Vitals  Encounter Vitals Group     BP 10/22/23 1423 117/77     Systolic BP Percentile --      Diastolic BP Percentile --      Pulse Rate 10/22/23 1423 88     Resp 10/22/23 1423 18     Temp 10/22/23 1423 98.7 F (37.1 C)     Temp Source 10/22/23 1423 Oral     SpO2 10/22/23 1423 96 %     Weight  10/22/23 1421 145 lb 1 oz (65.8 kg)     Height 10/22/23 1421 5' 3.5 (1.613 m)     Head Circumference --      Peak Flow --      Pain Score 10/22/23 1421 0     Pain Loc --      Pain Education --      Exclude from Growth Chart --    No data found.  Updated Vital Signs BP 117/77 (BP Location: Right Arm)   Pulse 88   Temp 98.7 F (37.1 C) (Oral)   Resp 18   Ht 5' 3.5 (1.613 m)   Wt 145 lb 1 oz (65.8 kg)   SpO2 96%   BMI 25.29 kg/m   Visual Acuity Right Eye Distance:   Left Eye Distance:   Bilateral Distance:    Right Eye Near:   Left Eye Near:    Bilateral Near:     Physical Exam Vitals and nursing note reviewed.  Constitutional:      General: She is not in acute distress.    Appearance: Normal appearance. She is not ill-appearing.  HENT:     Head: Normocephalic  and atraumatic.  Eyes:     Conjunctiva/sclera: Conjunctivae normal.  Cardiovascular:     Rate and Rhythm: Normal rate and regular rhythm.  Pulmonary:     Effort: Pulmonary effort is normal. No respiratory distress.     Breath sounds: Normal breath sounds. No wheezing, rhonchi or rales.  Abdominal:     General: Abdomen is flat. Bowel sounds are normal. There is no distension.     Palpations: Abdomen is soft.     Tenderness: There is no abdominal tenderness. There is no guarding or rebound.  Neurological:     Mental Status: She is alert.  Psychiatric:        Mood and Affect: Mood normal.        Behavior: Behavior normal.        Thought Content: Thought content normal.      UC Treatments / Results  Labs (all labs ordered are listed, but only abnormal results are displayed) Labs Reviewed - No data to display  EKG   Radiology No results found.  Procedures Procedures (including critical care time)  Medications Ordered in UC Medications  ondansetron  (ZOFRAN -ODT) disintegrating tablet 4 mg (4 mg Oral Given 10/22/23 1423)    Initial Impression / Assessment and Plan / UC Course  I have reviewed the triage vital signs and the nursing notes.  Pertinent labs & imaging results that were available during my care of the patient were reviewed by me and considered in my medical decision making (see chart for details).    Suspect viral etiology of gastroenteritis.  Recommended Zofran  for nausea, bland diet and increase fluids with electrolyte replacement.  Encouraged follow-up if no gradual improvement or with any further concerns.  Final Clinical Impressions(s) / UC Diagnoses   Final diagnoses:  Gastroenteritis   Discharge Instructions   None    ED Prescriptions     Medication Sig Dispense Auth. Provider   ondansetron  (ZOFRAN -ODT) 4 MG disintegrating tablet Take 1 tablet (4 mg total) by mouth every 8 (eight) hours as needed. 20 tablet Billy Asberry FALCON, PA-C      PDMP not  reviewed this encounter.   Billy Asberry FALCON, PA-C 10/22/23 [phone]

## 2023-10-22 NOTE — ED Triage Notes (Signed)
"  This started with diarrhea this morning now nausea as well with vomiting, last episode around 1030 am". No fever known. Last void "1030 am". No new/unexplained rash.

## 2023-11-15 ENCOUNTER — Encounter: Payer: Self-pay | Admitting: Emergency Medicine

## 2023-11-15 ENCOUNTER — Ambulatory Visit
Admission: EM | Admit: 2023-11-15 | Discharge: 2023-11-15 | Disposition: A | Discharge status: Home / Self Care | Attending: Physician Assistant | Admitting: Physician Assistant

## 2023-11-15 DIAGNOSIS — J209 Acute bronchitis, unspecified: Secondary | ICD-10-CM | POA: Insufficient documentation

## 2023-11-15 DIAGNOSIS — R051 Acute cough: Secondary | ICD-10-CM | POA: Insufficient documentation

## 2023-11-15 DIAGNOSIS — J029 Acute pharyngitis, unspecified: Secondary | ICD-10-CM | POA: Insufficient documentation

## 2023-11-15 LAB — SARS CORONAVIRUS 2 BY RT PCR: SARS Coronavirus 2 by RT PCR: NEGATIVE

## 2023-11-15 LAB — GROUP A STREP BY PCR: Group A Strep by PCR: NOT DETECTED

## 2023-11-15 MED ORDER — GUAIFENESIN-CODEINE 100-10 MG/5ML PO SOLN: 10.0000 mL | Freq: Four times a day (QID) | ORAL | 0 refills | Status: AC | PRN

## 2023-11-15 MED ORDER — PREDNISONE 20 MG PO TABS: 40.0000 mg | ORAL_TABLET | Freq: Every day | ORAL | 0 refills | Status: AC

## 2023-11-15 MED ORDER — GUAIFENESIN-CODEINE 100-10 MG/5ML PO SOLN: 10.0000 mL | Freq: Four times a day (QID) | ORAL | 0 refills | Status: DC | PRN

## 2023-11-15 NOTE — Discharge Instructions (Addendum)
-  COVID and strep negative. -Your symptoms are consistent with acute bronchitis which is generally viral.  It can last up to 6 weeks. - Increase rest and fluid intake.  I did send prednisone and a strong cough medication for you to try. - If symptoms are not better in a couple more weeks, you develop fever, increased fatigue/weakness, chest pain or breathing problem please return for reevaluation.

## 2023-11-15 NOTE — ED Provider Notes (Signed)
 [facility]    CSN: [id number] Arrival date & time: 11/15/23  1339      History   Chief Complaint Chief Complaint  Patient presents with   Cough    HPI Paula Ramirez is a 63 y.o. female presenting for approximately 4-day history of "barking" cough, congestion, sore throat.  Denies fever, fatigue, sinus pain, ear pain, chest pain, wheezing, shortness of breath.  Reports history of bronchitis, not related to smoking.  Patient is a non-smoker.  She denies any sick contacts.  Has been taking OTC meds without relief.  HPI  Past Medical History:  Diagnosis Date   Back pain    Hyperglycemia    Lyme disease     Patient Active Problem List   Diagnosis Date Noted   Alkaline phosphatase raised 10/22/2023   Constipation 10/22/2023   Colon cancer screening 10/22/2023   DDD (degenerative disc disease), cervical 10/22/2023   Rectal bleeding 10/22/2023   Vomiting without nausea 10/22/2023   Tick bite of back 02/06/2023   Acute cough 10/11/2021   Cervical radiculopathy 06/04/2019   Degenerative disc disease, lumbar 10/14/2017    Past Surgical History:  Procedure Laterality Date   BACK SURGERY      OB History   No obstetric history on file.      Home Medications    Prior to Admission medications   Medication Sig Start Date End Date Taking? Authorizing Provider  guaiFENesin-codeine 100-10 MG/5ML syrup Take 10 mLs by mouth every 6 (six) hours as needed for cough. 11/15/23  Yes Shirlee Latch, PA-C  predniSONE (DELTASONE) 20 MG tablet Take 2 tablets (40 mg total) by mouth daily for 5 days. 11/15/23 11/20/23 Yes Shirlee Latch, PA-C  albuterol (VENTOLIN HFA) 108 (90 Base) MCG/ACT inhaler Inhale 1-2 puffs into the lungs every 6 (six) hours as needed for wheezing or shortness of breath. 10/17/21   Charlott Holler, MD  Multiple Vitamin (MULTIVITAMIN) tablet Take 1 tablet by mouth daily.    [provider]  ondansetron (ZOFRAN-ODT) 4 MG disintegrating tablet Take  1 tablet (4 mg total) by mouth every 8 (eight) hours as needed. 10/22/23   Tomi Bamberger, PA-C  PEG 3350-[id number]-[id number] (GOLYTELY PO) See admin instructions. 05/16/23   [provider]    Family History Family History  Problem Relation Age of Onset   Hypertension Mother    Diabetes Mother     Social History Social History   Tobacco Use   Smoking status: Never   Smokeless tobacco: Never  Vaping Use   Vaping status: Never Used  Substance Use Topics   Alcohol use: No   Drug use: Never     Allergies   Celecoxib   Review of Systems Review of Systems  Constitutional:  Negative for chills, diaphoresis, fatigue and fever.  HENT:  Positive for congestion, rhinorrhea and sore throat. Negative for ear pain, sinus pressure and sinus pain.   Respiratory:  Positive for cough. Negative for shortness of breath.   Cardiovascular:  Negative for chest pain.  Gastrointestinal:  Negative for abdominal pain, nausea and vomiting.  Musculoskeletal:  Negative for arthralgias and myalgias.  Skin:  Negative for rash.  Neurological:  Negative for weakness and headaches.  Hematological:  Negative for adenopathy.     Physical Exam Triage Vital Signs ED Triage Vitals  Encounter Vitals Group     BP      Systolic BP Percentile      Diastolic BP Percentile  Pulse      Resp      Temp      Temp src      SpO2      Weight      Height      Head Circumference      Peak Flow      Pain Score      Pain Loc      Pain Education      Exclude from Growth Chart    No data found.  Updated Vital Signs BP (!) 124/98 (BP Location: Left Arm)   Pulse 69   Temp 98.6 F (37 C) (Oral)   Resp 14   Ht 5' 3.5" (1.613 m)   Wt 145 lb 1 oz (65.8 kg)   SpO2 97%   BMI 25.29 kg/m      Physical Exam Vitals and nursing note reviewed.  Constitutional:      General: She is not in acute distress.    Appearance: Normal appearance. She is not ill-appearing or toxic-appearing.      Comments: Frequent, barking cough  HENT:     Head: Normocephalic and atraumatic.     Nose: Congestion present.     Mouth/Throat:     Mouth: Mucous membranes are moist.     Pharynx: Oropharynx is clear. Posterior oropharyngeal erythema present.  Eyes:     General: No scleral icterus.       Right eye: No discharge.        Left eye: No discharge.     Conjunctiva/sclera: Conjunctivae normal.  Cardiovascular:     Rate and Rhythm: Normal rate and regular rhythm.     Heart sounds: Normal heart sounds.  Pulmonary:     Effort: Pulmonary effort is normal. No respiratory distress.     Breath sounds: Rhonchi present.  Musculoskeletal:     Cervical back: Neck supple.  Skin:    General: Skin is dry.  Neurological:     General: No focal deficit present.     Mental Status: She is alert. Mental status is at baseline.     Motor: No weakness.     Gait: Gait normal.  Psychiatric:        Mood and Affect: Mood normal.        Behavior: Behavior normal.      UC Treatments / Results  Labs (all labs ordered are listed, but only abnormal results are displayed) Labs Reviewed  SARS CORONAVIRUS 2 BY RT PCR  GROUP A STREP BY PCR    EKG   Radiology No results found.  Procedures Procedures (including critical care time)  Medications Ordered in UC Medications - No data to display  Initial Impression / Assessment and Plan / UC Course  I have reviewed the triage vital signs and the nursing notes.  Pertinent labs & imaging results that were available during my care of the patient were reviewed by me and considered in my medical decision making (see chart for details).   63 year old female presents for 4-day history of frequent "barking" cough, congestion and sore throat.  Denies fever, fatigue, chest pain or shortness of breath.  Vitals are stable.  She is overall well-appearing.  No acute distress.  On exam has mild nasal congestion and erythema posterior pharynx.  She has frequent barking  coughs.  Patient has scattered rhonchi throughout chest.  Heart regular rate and rhythm.  Strep and COVID-negative.  Advised patient symptoms assistant with viral bronchitis supportive care encouraged with increased rest  and fluids.  Sent prednisone to pharmacy.  Also sent Cheratussin.  Reviewed typical course of viral illness.  Reviewed return precautions.   Final Clinical Impressions(s) / UC Diagnoses   Final diagnoses:  Acute bronchitis, unspecified organism  Acute cough  Sore throat     Discharge Instructions      -COVID and strep negative. -Your symptoms are consistent with acute bronchitis which is generally viral.  It can last up to 6 weeks. - Increase rest and fluid intake.  I did send prednisone and a strong cough medication for you to try. - If symptoms are not better in a couple more weeks, you develop fever, increased fatigue/weakness, chest pain or breathing problem please return for reevaluation.     ED Prescriptions     Medication Sig Dispense Auth. Provider   predniSONE (DELTASONE) 20 MG tablet Take 2 tablets (40 mg total) by mouth daily for 5 days. 10 tablet Eusebio Friendly B, PA-C   guaiFENesin-codeine 100-10 MG/5ML syrup Take 10 mLs by mouth every 6 (six) hours as needed for cough. 120 mL Shirlee Latch, PA-C      I have reviewed the PDMP during this encounter.   Shirlee Latch, PA-C 11/15/23 [phone]

## 2023-11-15 NOTE — ED Triage Notes (Signed)
Patient c/o cough and chest congestion for 4 days.  Patient denies fevers.
